# Patient Record
Sex: Male | Born: 1950
Health system: Southern US, Community
[De-identification: ages and names within clinical notes are randomized; demographics above are authoritative.]

## PROBLEM LIST (undated history)

## (undated) DIAGNOSIS — H8110 Benign paroxysmal vertigo, unspecified ear: Secondary | ICD-10-CM

## (undated) DIAGNOSIS — A6000 Herpesviral infection of urogenital system, unspecified: Secondary | ICD-10-CM

## (undated) DIAGNOSIS — N400 Enlarged prostate without lower urinary tract symptoms: Secondary | ICD-10-CM

## (undated) DIAGNOSIS — F419 Anxiety disorder, unspecified: Secondary | ICD-10-CM

## (undated) DIAGNOSIS — D235 Other benign neoplasm of skin of trunk: Secondary | ICD-10-CM

## (undated) DIAGNOSIS — T7840XA Allergy, unspecified, initial encounter: Secondary | ICD-10-CM

## (undated) DIAGNOSIS — K529 Noninfective gastroenteritis and colitis, unspecified: Secondary | ICD-10-CM

## (undated) DIAGNOSIS — M1990 Unspecified osteoarthritis, unspecified site: Secondary | ICD-10-CM

## (undated) DIAGNOSIS — L821 Other seborrheic keratosis: Secondary | ICD-10-CM

## (undated) DIAGNOSIS — Z973 Presence of spectacles and contact lenses: Secondary | ICD-10-CM

## (undated) DIAGNOSIS — E079 Disorder of thyroid, unspecified: Secondary | ICD-10-CM

## (undated) DIAGNOSIS — L309 Dermatitis, unspecified: Secondary | ICD-10-CM

## (undated) DIAGNOSIS — K635 Polyp of colon: Secondary | ICD-10-CM

## (undated) HISTORY — DX: Noninfective gastroenteritis and colitis, unspecified: K52.9

## (undated) HISTORY — DX: Unspecified osteoarthritis, unspecified site: M19.90

## (undated) HISTORY — DX: Benign paroxysmal vertigo, unspecified ear: H81.10

## (undated) HISTORY — DX: Benign prostatic hyperplasia without lower urinary tract symptoms: N40.0

## (undated) HISTORY — DX: Dermatitis, unspecified: L30.9

## (undated) HISTORY — PX: POLYPECTOMY: SHX149

## (undated) HISTORY — DX: Herpesviral infection of urogenital system, unspecified: A60.00

## (undated) HISTORY — PX: VASECTOMY: SHX75

## (undated) HISTORY — PX: KNEE ARTHROSCOPY: SUR90

## (undated) HISTORY — DX: Anxiety disorder, unspecified: F41.9

## (undated) HISTORY — DX: Other seborrheic keratosis: L82.1

## (undated) HISTORY — DX: Other benign neoplasm of skin of trunk: D23.5

## (undated) HISTORY — DX: Allergy, unspecified, initial encounter: T78.40XA

## (undated) HISTORY — DX: Disorder of thyroid, unspecified: E07.9

## (undated) HISTORY — DX: Polyp of colon: K63.5

## (undated) HISTORY — PX: TONSILLECTOMY: SUR1361

## (undated) HISTORY — DX: Presence of spectacles and contact lenses: Z97.3

## (undated) HISTORY — PX: HERNIA REPAIR: SHX51

---

## 2003-06-03 HISTORY — PX: INGUINAL HERNIA REPAIR: SUR1180

## 2008-08-15 ENCOUNTER — Encounter: Payer: Self-pay | Admitting: Gastroenterology

## 2008-09-21 ENCOUNTER — Ambulatory Visit: Payer: Self-pay | Admitting: Gastroenterology

## 2008-09-21 DIAGNOSIS — K219 Gastro-esophageal reflux disease without esophagitis: Secondary | ICD-10-CM

## 2008-09-21 DIAGNOSIS — R109 Unspecified abdominal pain: Secondary | ICD-10-CM

## 2008-09-27 ENCOUNTER — Encounter: Payer: Self-pay | Admitting: Gastroenterology

## 2008-09-27 ENCOUNTER — Ambulatory Visit: Payer: Self-pay | Admitting: Gastroenterology

## 2008-10-01 ENCOUNTER — Encounter: Payer: Self-pay | Admitting: Gastroenterology

## 2010-12-09 ENCOUNTER — Encounter (INDEPENDENT_AMBULATORY_CARE_PROVIDER_SITE_OTHER): Payer: Self-pay | Admitting: General Surgery

## 2010-12-10 ENCOUNTER — Ambulatory Visit (INDEPENDENT_AMBULATORY_CARE_PROVIDER_SITE_OTHER): Payer: BC Managed Care – PPO | Admitting: General Surgery

## 2010-12-10 ENCOUNTER — Encounter (INDEPENDENT_AMBULATORY_CARE_PROVIDER_SITE_OTHER): Payer: Self-pay | Admitting: General Surgery

## 2010-12-10 VITALS — BP 124/86 | HR 72 | Temp 97.4°F | Ht 71.75 in | Wt 140.6 lb

## 2010-12-10 DIAGNOSIS — K4090 Unilateral inguinal hernia, without obstruction or gangrene, not specified as recurrent: Secondary | ICD-10-CM | POA: Insufficient documentation

## 2010-12-10 DIAGNOSIS — N400 Enlarged prostate without lower urinary tract symptoms: Secondary | ICD-10-CM | POA: Insufficient documentation

## 2010-12-10 NOTE — Progress Notes (Signed)
Subjective:     Patient ID: Paul Sparks, male   DOB: 01-16-1951, 60 y.o.   MRN: [id number]    BP 124/86  Pulse 72  Temp 97.4 F (36.3 C)  Ht 5' 11.75" (1.822 m)  Wt 140 lb 9.6 oz (63.776 kg)  BMI 19.20 kg/m2    HPI This is a 60 year old male referred by Kennedy Bucker, PA, for evaluation of a painful left inguinal hernia. He noted a lump in his left groin approximately one month ago has become more uncomfortable with time. He went to urgent care and was evaluated and noted to have a left inguinal hernia that is reducible. No change in his bowel or bladder habits. No obstructive symptoms.  Past Medical History  Diagnosis Date  . Colon polyp   . IBD (inflammatory bowel disease)   . Inguinal hernia   . Prostate enlargement     mildly and asymptomatic  . Allergy   . Wears glasses   . Arthritis    Past Surgical History  Procedure Date  . Inguinal hernia repair 2005    right  . Tonsillectomy   . Knee arthroscopy   . Colonoscopy     screening    Allergies  Allergen Reactions  . Celebrex (Celecoxib)     REACTION: Hives  . Flexeril (Cyclobenzaprine Hcl)   . Nsaids   . Sulfa Drugs Cross Reactors    Current outpatient prescriptions:cromolyn (NASALCROM) 5.2 MG/ACT nasal spray, Place 1 spray into the nose 2 (two) times daily.  , Disp: , Rfl: ;  glucosamine-chondroitin 500-400 MG tablet, Take 1 tablet by mouth 3 (three) times daily.  , Disp: , Rfl: ;  loratadine (CLARITIN) 10 MG tablet, Take 10 mg by mouth daily.  , Disp: , Rfl: ;  minoxidil (ROGAINE) 2 % external solution, Apply topically 2 (two) times daily.  , Disp: , Rfl:  phenylephrine (SUDAFED PE) 10 MG TABS, Take 10 mg by mouth every 4 (four) hours as needed.  , Disp: , Rfl:   History   Social History  . Marital Status: Single    Spouse Name: N/A    Number of Children: N/A  . Years of Education: N/A   Occupational History  . Not on file.   Social History Main Topics  . Smoking status: Never Smoker   . Smokeless  tobacco: Not on file  . Alcohol Use: 0.0 oz/week    10-14 Glasses of wine per week  . Drug Use: No  . Sexually Active:    Other Topics Concern  . Not on file   Social History Narrative  . No narrative on file   Mr. Vandunk does not currently have medications on file.   Review of Systems General:  _X_Normal __Fever__Weight change __Night sweats __Fatigue __Sleep loss  Breast:  _X_Normal __Lump __Pain __Nipple discharge __Infection __Other  Infectious Diseases:  _X_Normal __HIV/AIDS __Tuberculosis __Hepatitis A       __ Hepatitis B __Hepatitis C __ STD __MRSA(staph infection) __Other  Dental:  _X_Normal __Dentures __Other  Cardiac: _X_Normal  __Pacemaker __Defibrillator __Bypass surgery __High blood pressure      __ Peripheral vascular disease __Heart attack __Irregular heart beat __Valve       disease  Pulmonary:  _X_Normal __Cough/Sputum __Bronchitis __Asthma __COPD                    __Short of breath __Pneumonia __Sleep Apnea  Endocrine:  _X_Normal __Diabetes __Thyroid disease __High Cholesterol __Other  Skin:  _X_Normal  __Rash __Bruise easily __Cancer __Abnormal moles __Other  Gastrointestinal:  __Normal __Nausea/Vomiting/Diarrhea _X_Colon polyp or Cancer       _X_Irritable Bowel disease __Poor appetite __Hiatal hernia or reflux __Ulcer       __Liver disease __Abdominal pain _X_Hernia __Rectal bleeding or hemorrhoids       __Other  Genitourinary:  __Normal __Kidney disease or stones _X_Prostate problems        __Blood in urine __Difficulty voiding __Incontinence/leakage __Other  Neurological:  _X_Normal __Stroke __Paralysis __Seizure __Alzheimers disease       __Headaches __Dizziness __Fainting __Weakness __Other  Hematologic/Lymphatic:  _X_Normal __Bleeding disorder __Blood clots __Anemia       __Swollen lymph nodes __Blood Transfusion __Other  Immune System:  _X_Normal __Previous/Current Cancer-type:  __Other  HEENT:  _X_Normal __Hearing loss  __Hearing aid __Ear  infection __Nose bleed       __Hoarseness __Sore throat __Blurred or double vision __Glasses or contacts       __Glaucoma __Retinopathy __Macular degeneration __Other  Musculoskeletal:  _X_Normal __Joint pain __Arthritis __Other  OB/GYN:  __Normal __Vaginal bleeding or discharge __Currently pregnant___        Trying to conceive___ 1st period age ___ Age at 52st pregnancy       ___ Breast feeding___  Menopause___ Hormones___        Breast cancer in family___          Objective:   Physical Exam  Constitutional: He appears well-nourished.       Thin.  Eyes: Conjunctivae and EOM are normal.  Cardiovascular: Normal rate, regular rhythm and normal heart sounds.   No murmur heard. Pulmonary/Chest: Effort normal and breath sounds normal. No respiratory distress.  Abdominal: Soft. Bowel sounds are normal. He exhibits no distension and no mass.  Genitourinary:       Right inguinal scar.  Left inguinal bulge that is reducible.  Testicles normal.  Musculoskeletal: Normal range of motion. He exhibits no edema.       Assessment:     Symptomatic, reducible left inguinal hernia.    Plan:     Open left inguinal hernia repair with mesh.  He prefers not to have general anesthesia.  I have explained the procedure, risks, and aftercare of inguinal hernia repair.  Risks include but are not limited to bleeding, infection, wound problems, anesthesia, recurrence, bladder or intestine injury, urinary retention, testicular dysfunction, chronic pain, mesh problems.  He/she seems to understand and agrees to proceed.

## 2010-12-11 DIAGNOSIS — D235 Other benign neoplasm of skin of trunk: Secondary | ICD-10-CM

## 2010-12-11 DIAGNOSIS — L309 Dermatitis, unspecified: Secondary | ICD-10-CM

## 2010-12-11 DIAGNOSIS — L821 Other seborrheic keratosis: Secondary | ICD-10-CM

## 2010-12-11 HISTORY — DX: Dermatitis, unspecified: L30.9

## 2010-12-11 HISTORY — DX: Other seborrheic keratosis: L82.1

## 2010-12-11 HISTORY — DX: Other benign neoplasm of skin of trunk: D23.5

## 2011-01-02 ENCOUNTER — Ambulatory Visit
Admission: RE | Admit: 2011-01-02 | Discharge: 2011-01-02 | Disposition: A | Discharge status: Home / Self Care | Payer: BC Managed Care – PPO | Source: Ambulatory Visit | Attending: General Surgery | Admitting: General Surgery

## 2011-01-02 ENCOUNTER — Other Ambulatory Visit (INDEPENDENT_AMBULATORY_CARE_PROVIDER_SITE_OTHER): Payer: Self-pay | Admitting: General Surgery

## 2011-01-02 DIAGNOSIS — Z01811 Encounter for preprocedural respiratory examination: Secondary | ICD-10-CM

## 2011-01-14 DIAGNOSIS — K4090 Unilateral inguinal hernia, without obstruction or gangrene, not specified as recurrent: Secondary | ICD-10-CM

## 2011-01-29 ENCOUNTER — Encounter (INDEPENDENT_AMBULATORY_CARE_PROVIDER_SITE_OTHER): Payer: Self-pay | Admitting: General Surgery

## 2011-01-29 ENCOUNTER — Ambulatory Visit (INDEPENDENT_AMBULATORY_CARE_PROVIDER_SITE_OTHER): Payer: BC Managed Care – PPO | Admitting: General Surgery

## 2011-01-29 VITALS — BP 118/82 | HR 76

## 2011-01-29 DIAGNOSIS — K4090 Unilateral inguinal hernia, without obstruction or gangrene, not specified as recurrent: Secondary | ICD-10-CM

## 2011-01-29 NOTE — Progress Notes (Signed)
Operation:  [facility] repair with mesh  Date:01/13/11  Pathology:na  HPI:  Paul Sparks returns for his first postop visit.  He had minimal pain postop.  Some constipation.   Physical Exam: Left groin incision is c/d/i with minimal swelling; repair feels solid.  Assessment:  Doing well post [facility] repair.  Plan:  Continue light activities for 4 more weeks.  F/u prn.

## 2011-01-29 NOTE — Patient Instructions (Signed)
Continue light activities for 4 more weeks and resume normal activities.

## 2012-03-24 ENCOUNTER — Ambulatory Visit (INDEPENDENT_AMBULATORY_CARE_PROVIDER_SITE_OTHER): Payer: BC Managed Care – PPO | Admitting: Family Medicine

## 2012-03-24 VITALS — BP 118/72 | HR 88 | Temp 98.9°F | Resp 20 | Ht 71.0 in | Wt 143.0 lb

## 2012-03-24 DIAGNOSIS — Z0000 Encounter for general adult medical examination without abnormal findings: Secondary | ICD-10-CM

## 2012-03-24 DIAGNOSIS — K589 Irritable bowel syndrome without diarrhea: Secondary | ICD-10-CM

## 2012-03-24 DIAGNOSIS — K580 Irritable bowel syndrome with diarrhea: Secondary | ICD-10-CM | POA: Insufficient documentation

## 2012-03-24 DIAGNOSIS — L989 Disorder of the skin and subcutaneous tissue, unspecified: Secondary | ICD-10-CM

## 2012-03-24 DIAGNOSIS — J309 Allergic rhinitis, unspecified: Secondary | ICD-10-CM | POA: Insufficient documentation

## 2012-03-24 DIAGNOSIS — Z23 Encounter for immunization: Secondary | ICD-10-CM

## 2012-03-24 LAB — COMPREHENSIVE METABOLIC PANEL
ALT: 13 U/L (ref 0–53)
Albumin: 4.5 g/dL (ref 3.5–5.2)
BUN: 11 mg/dL (ref 6–23)
CO2: 32 mEq/L (ref 19–32)
Calcium: 9.7 mg/dL (ref 8.4–10.5)
Chloride: 103 mEq/L (ref 96–112)
Creat: 0.82 mg/dL (ref 0.50–1.35)

## 2012-03-24 LAB — LIPID PANEL
Cholesterol: 228 mg/dL — ABNORMAL HIGH (ref 0–200)
Total CHOL/HDL Ratio: 4.4 Ratio

## 2012-03-24 LAB — POCT CBC
HCT, POC: 43.4 % — AB (ref 43.5–53.7)
Hemoglobin: 13.3 g/dL — AB (ref 14.1–18.1)
Lymph, poc: 0.8 (ref 0.6–3.4)
MCH, POC: 30.6 pg (ref 27–31.2)
MCHC: 30.6 g/dL — AB (ref 31.8–35.4)
WBC: 3.7 10*3/uL — AB (ref 4.6–10.2)

## 2012-03-24 LAB — POCT GLYCOSYLATED HEMOGLOBIN (HGB A1C): Hemoglobin A1C: 5

## 2012-03-24 LAB — TSH: TSH: 2.2 u[IU]/mL (ref 0.350–4.500)

## 2012-03-24 LAB — VITAMIN B12: Vitamin B-12: 387 pg/mL (ref 211–911)

## 2012-03-24 MED ORDER — HYOSCYAMINE SULFATE 0.375 MG PO CP12: 1.0000 | ORAL_CAPSULE | Freq: Two times a day (BID) | ORAL | Status: DC | PRN

## 2012-03-24 NOTE — Assessment & Plan Note (Signed)
New.  Persistent lesion L facial; refer to dermatology for further evaluation.

## 2012-03-24 NOTE — Patient Instructions (Addendum)
1. Routine general medical examination at a health care facility  POCT CBC, POCT glycosylated hemoglobin (Hb A1C), POCT urinalysis dipstick, POCT UA - Microscopic Only, Comprehensive metabolic panel, Lipid panel, PSA, TSH, T4, free, Vitamin B12, Vitamin D 25 hydroxy, Folate, EKG 12-Lead  2. Need for Tdap vaccination  Tdap vaccine greater than or equal to 63yo IM  3. Skin lesion  Vitamin B12, Ambulatory referral to Dermatology  4. Irritable bowel syndrome with diarrhea  Hyoscyamine Sulfate 0.375 MG CP12

## 2012-03-24 NOTE — Assessment & Plan Note (Signed)
Stable; new to this provider; agreeable to rx for Hyoscyamine for PRN use.

## 2012-03-24 NOTE — Assessment & Plan Note (Signed)
S/p TDAP 03/24/12.

## 2012-03-24 NOTE — Progress Notes (Signed)
[address]   [place], [place]  [address]   [phone]  Subjective:    Patient ID: Paul Sparks, male    DOB: 1951-02-06, 61 y.o.   MRN: [id number]  HPI This 61 y.o. male presents for evaluation for CPE.  Last physical age 49 (three years ago).  Tetanus vaccine less than ten years ago; no previous TDAP.  Pneumovax never.  Zostavax never; interested in receiving.  Influenza vaccine last week.  Colonoscopy +adenomatous polyp single; repeat age 36.  Eye exam several years; +reading glasses; no glaucoma or cataracts.  Dental exam every six months.  1.  Skin lesions: wants  Facial skin lesion reviewed.  No skin cancers in past; last dermatology evaluation was in college for acne.  2.  IBS diarrhea, abdominal pain:  Chronic issue for several years.  Suffers with abdominal cramping, diarrhea when travels.  Stress related.  GI consultation for colonoscopy three years ago; prescribed hyoscyamine for PRN which works well for symptoms.  Wanting refill on Hyoscyamine rx.  S/p colonoscopy 2010; rx provided by Claudette Head.   PMH:  Allergic Rhinitis (allergic to pollen year round; allergy testing serum), IBS abdominal pain, diarrhea (previously prescribed Hyoscyamine 0.125mg  two every 6 hours PRN) chronic Psurg:  Hernia repairs B, knee surgery. All: see list. Medications:  Sudafed daily, Claritin daily, Glucosamine daily.   Social:  Married x 3 years; third marriage; happily married; 2 children, no grandchildren.  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  No tobacco; alcohol x wine and beer 2 servings.  No drugs.  Exercising 20 minutes x daily.  +seatbelt 100%; no guns in the home.  +sunscreen; avoids sun Family:  M-died age 64; multi-infarct dementia.  Tobacco abuse.    F-died age 56; died of starvation/dehydration; depression.    Siblings-died of overdose age 72;  sister 4 healthy; brother 40 healthy.      Review of Systems  Constitutional: Negative for fever, chills, diaphoresis, activity change, appetite change, fatigue and unexpected weight change.  HENT: Positive for hearing loss and congestion. Negative for ear pain, nosebleeds, sore throat, facial swelling, rhinorrhea, sneezing, drooling, mouth sores, trouble swallowing, neck pain, neck stiffness, dental problem, voice change, postnasal drip, sinus pressure, tinnitus and ear discharge.   Eyes: Negative for photophobia, pain, discharge, redness, itching and visual disturbance.  Respiratory: Positive for shortness of breath. Negative for apnea, cough, choking, chest tightness, wheezing and stridor.        SOB occurs at times of stress; also has improved with increased exercise.  Cardiovascular: Negative for chest pain, palpitations and leg swelling.  Gastrointestinal: Positive for diarrhea. Negative for nausea, vomiting, abdominal pain, constipation, blood in stool, abdominal distention, anal bleeding and rectal pain.  Genitourinary: Negative for dysuria, urgency, frequency, hematuria, flank pain, decreased urine volume, discharge, penile swelling, scrotal swelling, enuresis, difficulty urinating, genital sores, penile pain and testicular pain.  Musculoskeletal: Positive for back pain and arthralgias. Negative for myalgias, joint swelling and gait problem.  Neurological: Positive for dizziness and headaches. Negative for tremors, seizures, syncope, facial asymmetry, speech difficulty, weakness, light-headedness and numbness.  Hematological: Negative for adenopathy. Does not bruise/bleed easily.  Psychiatric/Behavioral: Negative for suicidal ideas, hallucinations, behavioral problems, confusion, disturbed wake/sleep cycle, self-injury, dysphoric mood, decreased concentration and agitation. The patient is not nervous/anxious and is not hyperactive.  Objective:   Physical Exam  Constitutional: He  is oriented to person, place, and time. He appears well-developed and well-nourished.  HENT:  Head: Normocephalic and atraumatic.  Right Ear: External ear normal.  Left Ear: External ear normal.  Nose: Nose normal.  Mouth/Throat: Oropharynx is clear and moist. No oropharyngeal exudate.  Eyes: Conjunctivae normal and EOM are normal. Pupils are equal, round, and reactive to light.  Neck: Normal range of motion. Neck supple. No JVD present. No tracheal deviation present. No thyromegaly present.  Cardiovascular: Normal rate, regular rhythm, normal heart sounds and intact distal pulses.  Exam reveals no gallop and no friction rub.   No murmur heard. Pulmonary/Chest: Effort normal and breath sounds normal. No stridor. No respiratory distress. He has no wheezes. He has no rales.  Abdominal: Soft. Bowel sounds are normal. He exhibits no distension and no mass. There is no tenderness. There is no rebound and no guarding. Hernia confirmed negative in the right inguinal area and confirmed negative in the left inguinal area.  Genitourinary: Rectum normal, testes normal and penis normal. Rectal exam shows no mass and anal tone normal. Prostate is enlarged. Prostate is not tender. Circumcised. No penile tenderness.  Musculoskeletal: Normal range of motion.  Lymphadenopathy:    He has no cervical adenopathy.       Right: No inguinal adenopathy present.       Left: No inguinal adenopathy present.  Neurological: He is alert and oriented to person, place, and time. No cranial nerve deficit. He exhibits normal muscle tone.  Skin: Skin is warm and dry. No rash noted.       L facial region maxillary with milia type lesion with surrounding inferior erythema 4mm diameter.    Psychiatric: He has a normal mood and affect. His behavior is normal. Judgment and thought content normal.    TDAP ADMINISTERED DURING VISIT.  EKG: NSR; NO ACUTE CHANGES.     Assessment & Plan:   1. Routine general medical examination  at a health care facility  POCT CBC, POCT glycosylated hemoglobin (Hb A1C), POCT urinalysis dipstick, POCT UA - Microscopic Only, Comprehensive metabolic panel, Lipid panel, PSA, TSH, T4, free, Vitamin B12, Vitamin D 25 hydroxy, Folate, EKG 12-Lead  2. Need for Tdap vaccination  Tdap vaccine greater than or equal to 7yo IM  3. Skin lesion  Vitamin B12, Ambulatory referral to Dermatology  4. Irritable bowel syndrome with diarrhea  Hyoscyamine Sulfate 0.375 MG CP12

## 2012-03-24 NOTE — Assessment & Plan Note (Signed)
Anticipatory guidance provided --- start ASA 81mg  daily.  Colonoscopy UTD in 2010; s/p TDAP in office; rx for Zostavax provided to receive at pharmacy; s/p flu vaccine this season.  Obtain labs.

## 2012-03-25 LAB — VITAMIN D 25 HYDROXY (VIT D DEFICIENCY, FRACTURES): Vit D, 25-Hydroxy: 35 ng/mL (ref 30–89)

## 2012-04-06 NOTE — Progress Notes (Signed)
One month f-up appt made for 04/27/12 with Dr. Katrinka Blazing. Paul Sparks

## 2012-04-27 ENCOUNTER — Ambulatory Visit (INDEPENDENT_AMBULATORY_CARE_PROVIDER_SITE_OTHER): Payer: BC Managed Care – PPO | Admitting: Family Medicine

## 2012-04-27 ENCOUNTER — Encounter: Payer: Self-pay | Admitting: Family Medicine

## 2012-04-27 VITALS — BP 117/72 | HR 81 | Temp 98.3°F | Resp 16 | Ht 71.0 in | Wt 142.0 lb

## 2012-04-27 DIAGNOSIS — D649 Anemia, unspecified: Secondary | ICD-10-CM | POA: Insufficient documentation

## 2012-04-27 DIAGNOSIS — E538 Deficiency of other specified B group vitamins: Secondary | ICD-10-CM | POA: Insufficient documentation

## 2012-04-27 DIAGNOSIS — E7800 Pure hypercholesterolemia, unspecified: Secondary | ICD-10-CM

## 2012-04-27 LAB — CBC WITH DIFFERENTIAL/PLATELET
Hemoglobin: 14.2 g/dL (ref 13.0–17.0)
Lymphocytes Relative: 26 % (ref 12–46)
Lymphs Abs: 1 10*3/uL (ref 0.7–4.0)
MCH: 31.9 pg (ref 26.0–34.0)
Monocytes Relative: 9 % (ref 3–12)
Neutro Abs: 2.3 10*3/uL (ref 1.7–7.7)
Neutrophils Relative %: 63 % (ref 43–77)
Platelets: 161 10*3/uL (ref 150–400)
RBC: 4.45 MIL/uL (ref 4.22–5.81)
WBC: 3.7 10*3/uL — ABNORMAL LOW (ref 4.0–10.5)

## 2012-04-27 NOTE — Assessment & Plan Note (Signed)
New.  Tolerating multivitamin daily.  Obtain labs.

## 2012-04-27 NOTE — Assessment & Plan Note (Signed)
New.  Vegetarian diet with ideal BMI; also regular exercise.   Continue with dietary modification.  If worsens, will warrant statin therapy.

## 2012-04-27 NOTE — Patient Instructions (Addendum)
1. Anemia  CBC with Differential  2. Folate deficiency  Folate

## 2012-04-27 NOTE — Assessment & Plan Note (Signed)
New.  Repeat today.  Likely associated with recent illness at time of CPE one month ago.  If persists, will warrant hemosure completion and referral to GI.  Currently asymptomatic.

## 2012-04-27 NOTE — Progress Notes (Signed)
[address]   [place], [place]  [address]   [phone]  Subjective:    Patient ID: Paul Sparks, male    DOB: 07-30-1950, 61 y.o.   MRN: [id number]  HPIThis 61 y.o. male presents for one month follow-up for evaluation of the following:  1.  Anemia:  New at CPE one month ago.  Sister with abnormal labs in past two years; large work up without diagnosis/etiology.  Continues to monitor sister.  Colonoscopy 2010.  Physician in [place] tested H. Pylori negative in past due to chronic abdominal pain.  No history of EGD.  No bloody stools or melena.  S/p hernia surgery one year ago; bowels are unsettled since then.  More constipated now than baseline.  High roughage diet.  No nausea, vomiting.  No worsening abdominal pain.  No night sweats.  No unintentional weight loss.    2. Folic acid deficiency:  Started MVI.   Very high vegetarian diet.  Diet very high in folate.    3.  Hypercholesterolemia:  Persistently elevated. Vegetarian now.  Eats almost no cholesterol.  Exceptions are: yogurt daily with 10% cholesterol; no other foods with cholesterol.  B: oats, walnuts.  Lunch:  No added salt; soup all vegetables, spice.  Red wine.  Supper:  One egg per week; eggs, beans, toast with soy sausages, vegetarian chili.  Stir fry.  No family history of AMI.  CVAs in 19s.  Mother with multi-infarct dementia but smoker; onset late 69s.   No fish oil supplement due to GI side effects.    Review of Systems  Constitutional: Negative for fever, chills, diaphoresis, activity change, appetite change, fatigue and unexpected weight change.  Gastrointestinal: Positive for abdominal pain and constipation. Negative for nausea, vomiting, diarrhea, blood in stool, abdominal distention, anal bleeding and rectal pain.    Past Medical History  Diagnosis Date  . Colon polyp 06/02/2008    colonoscopy [facility].  . IBD (inflammatory bowel disease)     diarrhea, abdominal cramping.  . Inguinal hernia     Bilateral; s/p  repair.  . Prostate enlargement     mildly and asymptomatic  . Allergy   . Wears glasses   . Arthritis   . Eczema 12/11/10    [name].  . Seborrheic keratosis 12/11/10    [name]/Dermatology consult  . Dermatofibroma of back 12/11/10    [name].    Past Surgical History  Procedure Date  . Inguinal hernia repair 2005    right  . Tonsillectomy   . Knee arthroscopy   . Colonoscopy 06/02/2008    screening; single adenomatous polyp. Claudette Head, MD    Prior to Admission medications   Medication Sig Start Date End Date Taking? Authorizing Provider  aspirin 81 MG tablet Take 81 mg by mouth daily.   Yes Historical Provider, MD  cromolyn (NASALCROM) 5.2 MG/ACT nasal spray Place 1 spray into the nose 2 (two) times daily.     Yes Historical Provider, MD  glucosamine-chondroitin 500-400 MG tablet Take 1 tablet by mouth 3 (three) times daily.     Yes Historical Provider, MD  Hyoscyamine Sulfate 0.375 MG CP12 Take 1 capsule (0.375 mg total) by mouth 2 (two) times daily as needed. 03/24/12  Yes Ethelda Chick, MD  loratadine (CLARITIN) 10 MG tablet Take 10 mg by mouth daily.     Yes Historical Provider, MD  minoxidil (ROGAINE) 2 % external solution Apply topically 2 (two) times daily.     Yes Historical  Provider, MD  Multiple Vitamins-Minerals (MULTIVITAMIN WITH MINERALS) tablet Take 1 tablet by mouth daily.   Yes Historical Provider, MD  phenylephrine (SUDAFED PE) 10 MG TABS Take 10 mg by mouth every 4 (four) hours as needed.     Yes Historical Provider, MD    Allergies  Allergen Reactions  . Celebrex (Celecoxib)     REACTION: Hives  . Flexeril (Cyclobenzaprine Hcl)   . Nsaids   . Sulfa Drugs Cross Reactors     History   Social History  . Marital Status: Married    Spouse Name: N/A    Number of Children: N/A  . Years of Education: N/A   Occupational History  . Not on file.   Social History Main Topics  . Smoking status: Never Smoker   . Smokeless  tobacco: Never Used  . Alcohol Use: 0.0 oz/week    10-14 Glasses of wine per week  . Drug Use: No  . Sexually Active: Yes    Birth Control/ Protection: None   Other Topics Concern  . Not on file   Social History Narrative   Marital status:  Married x 3 years; third marriage; happily married.   Children: two; no grandchildren.   Lives: with wife.   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     Tobacco: none   Alcohol: two daily wine and beer.   Drugs: none   Exercise:  Walks daily for 20 minutes; +yoga.   Seatbelt: 100% of time   Guns in home: none   Sunscreen: yes; rare sun exposure.    Family History  Problem Relation Age of Onset  . Dementia Mother     Multi-infarct dementia  . Diabetes Father        Objective:   Physical Exam  Nursing note and vitals reviewed. Constitutional: He is oriented to person, place, and time. He appears well-developed and well-nourished. No distress.  Neck: Normal range of motion. Neck supple.  Cardiovascular: Normal rate, regular rhythm and normal heart sounds.  Exam reveals no gallop and no friction rub.   No murmur heard. Pulmonary/Chest: Effort normal and breath sounds normal.  Neurological: He is alert and oriented to person, place, and time. No cranial nerve deficit. He exhibits normal muscle tone. Coordination normal.  Skin: He is not diaphoretic.  Psychiatric: He has a normal mood and affect. His behavior is normal. Judgment and thought content normal.        Assessment & Plan:   1. Anemia  CBC with Differential  2. Folate deficiency  Folate  3. Pure hypercholesterolemia

## 2012-05-02 LAB — FOLATE: Folate: 14.3 ng/mL (ref >5.4)

## 2012-05-03 NOTE — Progress Notes (Signed)
Appt made with Dr. Katrinka Blazing for 08/03/11. Paul Sparks

## 2012-07-17 ENCOUNTER — Other Ambulatory Visit: Payer: Self-pay

## 2012-08-02 ENCOUNTER — Ambulatory Visit (INDEPENDENT_AMBULATORY_CARE_PROVIDER_SITE_OTHER): Payer: BC Managed Care – PPO | Admitting: Family Medicine

## 2012-08-02 ENCOUNTER — Encounter: Payer: Self-pay | Admitting: Family Medicine

## 2012-08-02 VITALS — BP 102/64 | HR 87 | Temp 98.2°F | Resp 16 | Ht 71.0 in | Wt 145.0 lb

## 2012-08-02 DIAGNOSIS — D649 Anemia, unspecified: Secondary | ICD-10-CM

## 2012-08-02 DIAGNOSIS — D72819 Decreased white blood cell count, unspecified: Secondary | ICD-10-CM | POA: Insufficient documentation

## 2012-08-02 DIAGNOSIS — M51369 Other intervertebral disc degeneration, lumbar region without mention of lumbar back pain or lower extremity pain: Secondary | ICD-10-CM | POA: Insufficient documentation

## 2012-08-02 DIAGNOSIS — R5381 Other malaise: Secondary | ICD-10-CM

## 2012-08-02 DIAGNOSIS — M5136 Other intervertebral disc degeneration, lumbar region: Secondary | ICD-10-CM | POA: Insufficient documentation

## 2012-08-02 DIAGNOSIS — M5030 Other cervical disc degeneration, unspecified cervical region: Secondary | ICD-10-CM | POA: Insufficient documentation

## 2012-08-02 DIAGNOSIS — R5383 Other fatigue: Secondary | ICD-10-CM | POA: Insufficient documentation

## 2012-08-02 LAB — COMPREHENSIVE METABOLIC PANEL
ALT: 17 U/L (ref 0–53)
AST: 23 U/L (ref 0–37)
Albumin: 4.4 g/dL (ref 3.5–5.2)
Alkaline Phosphatase: 72 U/L (ref 39–117)
BUN: 10 mg/dL (ref 6–23)
Calcium: 9.4 mg/dL (ref 8.4–10.5)
Chloride: 102 mEq/L (ref 96–112)
Potassium: 4.3 mEq/L (ref 3.5–5.3)
Sodium: 137 mEq/L (ref 135–145)
Total Protein: 6.5 g/dL (ref 6.0–8.3)

## 2012-08-02 LAB — CBC
HCT: 41.7 % (ref 39.0–52.0)
Hemoglobin: 14.6 g/dL (ref 13.0–17.0)
RBC: 4.54 MIL/uL (ref 4.22–5.81)
WBC: 3.9 10*3/uL — ABNORMAL LOW (ref 4.0–10.5)

## 2012-08-02 NOTE — Patient Instructions (Addendum)
Anemia - Plan: CBC  Leukocytopenia, unspecified  Other malaise and fatigue  Degenerative disc disease, cervical

## 2012-08-02 NOTE — Progress Notes (Signed)
[address]   [place], [place]  [address]   [phone]  Subjective:    Patient ID: Paul Sparks, male    DOB: 1950-08-28, 62 y.o.   MRN: [id number]  HPI This 62 y.o. male presents for three month follow-up and for evaluation of the following:  1.  Leukocytosis: due for repeat labs.  Last WBC 04/2012 with persistently low count.  +fatigue severe since last visit but major work stressors.  2.  Fatigue:  Onset two months ago.  Feels related to decision to continue working; was hoping to retire in 10/2012 when turning age 79 and now unable to retire at that age; not sure when will be able to retire.  Denies weight loss, night sweats, decrease in appetite.  Sleeping well.  Denies insomnia.  Denies headaches.  Denies chest pain, SOB, cough.  Denies nausea, vomiting, diarrhea, constipation, bloody stools, black stools.  Denies depressed mood but mood has been affected.  3.  Abdominal pain:  Has been suffering with worsening lower abdominal pressure and discomfort in lower abdominal region and genital region; onset after hernia surgery but has worsened since last visit.  Started taking Hyoscyamine one daily with improvement in lower abdominal and genital pressure.  4.  DDD neck and lumbar spine:  Chronic issue with recent worsening in past two months; followed regularly and chronically by chiropractor; scheduled to undergo repeat xrays next week; last xrays a year ago approximately; not interested in medications for pain.  5. Immunizations: s/p [facility] at [facility]; had to pay cash full.  Would have paid for immunization to be administered at doctor's office.   Review of Systems  Constitutional: Positive for fatigue. Negative for fever, chills, diaphoresis, activity change, appetite change and unexpected weight change.  Respiratory: Negative for cough and shortness of breath.   Cardiovascular: Negative for chest pain and palpitations.  Gastrointestinal: Positive for abdominal pain. Negative for  nausea, vomiting, diarrhea, constipation, blood in stool, abdominal distention, anal bleeding and rectal pain.  Musculoskeletal: Positive for myalgias, back pain and arthralgias.  Neurological: Negative for headaches.  Hematological: Negative for adenopathy. Does not bruise/bleed easily.  Psychiatric/Behavioral: Negative for behavioral problems, sleep disturbance and dysphoric mood. The patient is not nervous/anxious.         Past Medical History  Diagnosis Date  . Colon polyp 06/02/2008    colonoscopy Claudette Head.  . IBD (inflammatory bowel disease)     diarrhea, abdominal cramping.  . Inguinal hernia     Bilateral; s/p repair.  . Prostate enlargement     mildly and asymptomatic  . Allergy   . Wears glasses   . Arthritis   . Eczema 12/11/10    [name].  . Seborrheic keratosis 12/11/10    [name]/Dermatology consult  . Dermatofibroma of back 12/11/10    [name].    Past Surgical History  Procedure Laterality Date  . Inguinal hernia repair  2005    right  . Tonsillectomy    . Knee arthroscopy    . Colonoscopy  06/02/2008    screening; single adenomatous polyp. Claudette Head, MD    Prior to Admission medications   Medication Sig Start Date End Date Taking? Authorizing Provider  aspirin 81 MG tablet Take 81 mg by mouth daily.   Yes Historical Provider, MD  cromolyn (NASALCROM) 5.2 MG/ACT nasal spray Place 1 spray into the nose 2 (two) times daily.     Yes Historical Provider, MD  glucosamine-chondroitin 500-400 MG tablet Take 1 tablet by  mouth 3 (three) times daily.     Yes Historical Provider, MD  Hyoscyamine Sulfate 0.375 MG CP12 Take 1 capsule (0.375 mg total) by mouth 2 (two) times daily as needed. 03/24/12  Yes Ethelda Chick, MD  loratadine (CLARITIN) 10 MG tablet Take 10 mg by mouth daily.     Yes Historical Provider, MD  minoxidil (ROGAINE) 2 % external solution Apply topically 2 (two) times daily.     Yes Historical Provider, MD  Multiple  Vitamins-Minerals (MULTIVITAMIN WITH MINERALS) tablet Take 1 tablet by mouth daily.   Yes Historical Provider, MD  phenylephrine (SUDAFED PE) 10 MG TABS Take 10 mg by mouth every 4 (four) hours as needed.     Yes Historical Provider, MD    Allergies  Allergen Reactions  . Celebrex (Celecoxib)     REACTION: Hives  . Flexeril (Cyclobenzaprine Hcl)   . Nsaids   . Sulfa Drugs Cross Reactors     History   Social History  . Marital Status: Married    Spouse Name: N/A    Number of Children: N/A  . Years of Education: N/A   Occupational History  . Not on file.   Social History Main Topics  . Smoking status: Never Smoker   . Smokeless tobacco: Never Used  . Alcohol Use: 0.0 oz/week    10-14 Glasses of wine per week  . Drug Use: No  . Sexually Active: Yes    Birth Control/ Protection: None   Other Topics Concern  . Not on file   Social History Narrative   Marital status:  Married x 3 years; third marriage; happily married.      Children: two; no grandchildren.      Lives: with wife.      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        Tobacco: none      Alcohol: two daily wine and beer.      Drugs: none      Exercise:  Walks daily for 20 minutes; +yoga.      Seatbelt: 100% of time      Guns in home: none      Sunscreen: yes; rare sun exposure.    Family History  Problem Relation Age of Onset  . Dementia Mother     Multi-infarct dementia  . Diabetes Father     Objective:   Physical Exam  Nursing note and vitals reviewed. Constitutional: He is oriented to person, place, and time. He appears well-developed and well-nourished. No distress.  HENT:  Mouth/Throat: Oropharynx is clear and moist.  Eyes: Conjunctivae and EOM are normal. Pupils are equal, round, and reactive to light.  Neck: Normal range of motion. Neck supple. No thyromegaly present.  Cardiovascular: Normal rate, regular rhythm and normal heart sounds.  Exam  reveals no gallop and no friction rub.   No murmur heard. Pulmonary/Chest: Effort normal and breath sounds normal.  Abdominal: Soft. Bowel sounds are normal. He exhibits no distension and no mass. There is no tenderness. There is no rebound and no guarding. Hernia confirmed negative in the right inguinal area and confirmed negative in the left inguinal area.  Well healed incision scar LLQ.  Genitourinary: Testes normal and penis normal. Right testis shows no mass, no swelling and no tenderness. Left testis shows no mass, no swelling and no tenderness.  Musculoskeletal:       Right shoulder: Normal.  Left shoulder: Normal.       Cervical back: Normal.       Thoracic back: Normal.       Lumbar back: Normal.  Lymphadenopathy:    He has no cervical adenopathy.       Right: No inguinal adenopathy present.       Left: No inguinal adenopathy present.  Neurological: He is alert and oriented to person, place, and time.  Skin: Skin is warm and dry. No rash noted. He is not diaphoretic.  Psychiatric: He has a normal mood and affect. His behavior is normal. Judgment and thought content normal.       Assessment & Plan:  Anemia - Plan: CBC  Leukocytopenia, unspecified  Other malaise and fatigue  Degenerative disc disease, cervical  Degenerative disc disease, lumbar    1. Leukocytopenia: Persistent; associated with new onset fatigue; repeat labs today. 2.  Fatigue: New.  Onset two months ago with work related stressors.  Obtain labs including CBC, CMET, TSH, total testosterone levels.  Recent CPE with overall normal labs other than low WBC count.  Discussed symptoms of depression with recent stressors; advised to return if develops depressive symptoms. 3.  Cervical and Lumbar DDD:  Worsening; scheduled to undergo xrays with chiropractor next week and agreeable; warrants repeat films with worsening pain.  Declined rx for worsening symptoms. 4.  S/p Zostavax since last visit.

## 2012-08-17 ENCOUNTER — Encounter: Payer: Self-pay | Admitting: Family Medicine

## 2013-04-07 ENCOUNTER — Other Ambulatory Visit: Payer: Self-pay

## 2013-04-11 ENCOUNTER — Ambulatory Visit (INDEPENDENT_AMBULATORY_CARE_PROVIDER_SITE_OTHER): Payer: BC Managed Care – PPO | Admitting: Family Medicine

## 2013-04-11 ENCOUNTER — Encounter: Payer: Self-pay | Admitting: Family Medicine

## 2013-04-11 VITALS — BP 120/68 | HR 94 | Temp 98.0°F | Resp 16 | Ht 72.0 in | Wt 138.0 lb

## 2013-04-11 DIAGNOSIS — Z0000 Encounter for general adult medical examination without abnormal findings: Secondary | ICD-10-CM

## 2013-04-11 LAB — COMPREHENSIVE METABOLIC PANEL
ALT: 17 U/L (ref 0–53)
AST: 26 U/L (ref 0–37)
BUN: 10 mg/dL (ref 6–23)
CO2: 28 mEq/L (ref 19–32)
Calcium: 9.7 mg/dL (ref 8.4–10.5)
Chloride: 101 mEq/L (ref 96–112)
Creat: 0.8 mg/dL (ref 0.50–1.35)
Total Bilirubin: 0.7 mg/dL (ref 0.3–1.2)

## 2013-04-11 LAB — FOLATE: Folate: >20 ng/mL

## 2013-04-11 LAB — CBC WITH DIFFERENTIAL/PLATELET
Basophils Absolute: 0 10*3/uL (ref 0.0–0.1)
Eosinophils Absolute: 0.1 10*3/uL (ref 0.0–0.7)
Eosinophils Relative: 1 % (ref 0–5)
HCT: 43 % (ref 39.0–52.0)
Lymphocytes Relative: 21 % (ref 12–46)
MCH: 33 pg (ref 26.0–34.0)
MCHC: 34.9 g/dL (ref 30.0–36.0)
MCV: 94.7 fL (ref 78.0–100.0)
Monocytes Absolute: 0.4 10*3/uL (ref 0.1–1.0)
Platelets: 187 10*3/uL (ref 150–400)
RDW: 13.3 % (ref 11.5–15.5)

## 2013-04-11 LAB — LIPID PANEL
Cholesterol: 237 mg/dL — ABNORMAL HIGH (ref 0–200)
Triglycerides: 177 mg/dL — ABNORMAL HIGH (ref <150)
VLDL: 35 mg/dL (ref 0–40)

## 2013-04-11 LAB — HEMOGLOBIN A1C: Mean Plasma Glucose: 108 mg/dL (ref <117)

## 2013-04-11 LAB — POCT URINALYSIS DIPSTICK
Blood, UA: NEGATIVE
Ketones, UA: NEGATIVE
Protein, UA: NEGATIVE
Spec Grav, UA: 1.02
Urobilinogen, UA: 0.2

## 2013-04-11 LAB — IFOBT (OCCULT BLOOD): IFOBT: NEGATIVE

## 2013-04-11 LAB — TSH: TSH: 1.978 u[IU]/mL (ref 0.350–4.500)

## 2013-04-11 NOTE — Progress Notes (Signed)
[address]   [place], [place]  [address]   [phone]  Subjective:    Patient ID: Paul Sparks, male    DOB: 1950-11-28, 62 y.o.   MRN: [id number]  HPI This 62 y.o. male presents for Complete Physical exam.  Last physical 03/29/12. Colonoscopy 2010. TDAP 03/24/13. Pneumovax never. Zostavax 05/02/2012. Flu vaccine 02/2012; 01/2013 [facility]. Eye exam not yet.  Reading glasses.  No gluacoma or cataracts.   Dental exam not yet.  Review of Systems  Constitutional: Positive for fatigue. Negative for fever, chills, diaphoresis, activity change, appetite change and unexpected weight change.  HENT: Negative.   Eyes: Positive for visual disturbance.  Respiratory: Negative.   Cardiovascular: Negative.   Gastrointestinal: Positive for abdominal pain, diarrhea, constipation and rectal pain. Negative for nausea, blood in stool, abdominal distention and anal bleeding.  Endocrine: Negative.   Genitourinary: Negative.   Musculoskeletal: Positive for arthralgias, back pain, myalgias, neck pain and neck stiffness. Negative for gait problem and joint swelling.  Skin: Negative.   Allergic/Immunologic: Positive for environmental allergies.  Neurological: Negative.   Hematological: Bruises/bleeds easily.  Psychiatric/Behavioral: Positive for sleep disturbance. Negative for suicidal ideas, self-injury and dysphoric mood. The patient is nervous/anxious.    Past Medical History  Diagnosis Date  . Colon polyp 06/02/2008    colonoscopy [facility]. Repeat in 5 years.  . IBD (inflammatory bowel disease)     diarrhea, abdominal cramping.  . Inguinal hernia     Bilateral; s/p repair.  . Prostate enlargement     mildly and asymptomatic  . Allergy   . Wears glasses   . Arthritis   . Eczema 12/11/10    [name].  . Seborrheic keratosis 12/11/10    [name]/Dermatology consult  . Dermatofibroma of back 12/11/10    [name].   Past Surgical History  Procedure Laterality Date  .  Inguinal hernia repair  2005    right  . Tonsillectomy    . Knee arthroscopy    . Colonoscopy  06/02/2008    screening; single adenomatous polyp. Claudette Head, MD.  Repeat in 5 years.   Allergies  Allergen Reactions  . Celebrex [Celecoxib]     REACTION: Hives  . Flexeril [Cyclobenzaprine Hcl]   . Nsaids   . Sulfa Drugs Cross Reactors    Current Outpatient Prescriptions on File Prior to Visit  Medication Sig Dispense Refill  . aspirin 81 MG tablet Take 81 mg by mouth daily.      . cromolyn (NASALCROM) 5.2 MG/ACT nasal spray Place 1 spray into the nose 2 (two) times daily.        Marland Kitchen glucosamine-chondroitin 500-400 MG tablet Take 1 tablet by mouth 3 (three) times daily.        Marland Kitchen Hyoscyamine Sulfate 0.375 MG CP12 Take 1 capsule (0.375 mg total) by mouth 2 (two) times daily as needed.  60 each  5  . loratadine (CLARITIN) 10 MG tablet Take 10 mg by mouth daily.        . minoxidil (ROGAINE) 2 % external solution Apply topically 2 (two) times daily.        . Multiple Vitamins-Minerals (MULTIVITAMIN WITH MINERALS) tablet Take 1 tablet by mouth daily.      . phenylephrine (SUDAFED PE) 10 MG TABS Take 10 mg by mouth every 4 (four) hours as needed.         No current facility-administered medications on file prior to visit.   History   Social History  . Marital Status:  Married    Spouse Name: N/A    Number of Children: N/A  . Years of Education: N/A   Occupational History  . Not on file.   Social History Main Topics  . Smoking status: Never Smoker   . Smokeless tobacco: Never Used  . Alcohol Use: 0.0 oz/week    10-14 Glasses of wine per week  . Drug Use: No  . Sexual Activity: Yes    Birth Control/ Protection: None   Other Topics Concern  . Not on file   Social History Narrative   Marital status:  Married x 4 years; third marriage; happily married.      Children: two; no grandchildren.      Lives: with wife.      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        Tobacco: none      Alcohol: two daily wine and beer.        Drugs: none      Exercise:  Walks daily for 20 minutes; walks dog regularly.      Seatbelt: 100% of time      Guns in home: none      Sunscreen: yes; rare sun exposure.   Family History  Problem Relation Age of Onset  . Dementia Mother     Multi-infarct dementia  . Diabetes Father        Objective:   Physical Exam  Constitutional: He is oriented to person, place, and time. He appears well-developed and well-nourished. No distress.  HENT:  Head: Normocephalic and atraumatic.  Right Ear: External ear normal.  Left Ear: External ear normal.  Nose: Nose normal.  Mouth/Throat: Oropharynx is clear and moist.  Eyes: Conjunctivae and EOM are normal. Pupils are equal, round, and reactive to light.  Neck: Normal range of motion. Neck supple. Carotid bruit is not present. No thyromegaly present.  Cardiovascular: Normal rate, regular rhythm, normal heart sounds and intact distal pulses.  Exam reveals no gallop and no friction rub.   No murmur heard. Pulmonary/Chest: Effort normal and breath sounds normal. He has no wheezes. He has no rales.  Abdominal: Soft. Bowel sounds are normal. He exhibits no distension and no mass. There is no tenderness. There is no rebound and no guarding. Hernia confirmed negative in the right inguinal area and confirmed negative in the left inguinal area.  Genitourinary: Rectum normal, prostate normal, testes normal and penis normal. Right testis shows no mass, no swelling and no tenderness. Left testis shows no mass, no swelling and no tenderness. Circumcised.  Musculoskeletal:       Right shoulder: Normal.       Left shoulder: Normal.       Cervical back: Normal.  Lymphadenopathy:    He has no cervical adenopathy.       Right: No inguinal adenopathy present.       Left: No inguinal adenopathy present.  Neurological: He is alert and oriented to person, place, and time.  He has normal reflexes. No cranial nerve deficit. He exhibits normal muscle tone. Coordination normal.  Skin: Skin is warm and dry. No rash noted. He is not diaphoretic.  Psychiatric: He has a normal mood and affect. His behavior is normal. Judgment and thought content normal.       Assessment & Plan:  Annual physical exam - Plan: CBC with Differential, Comprehensive metabolic panel, TSH, Hemoglobin A1c, Lipid panel, EKG 12-Lead, PSA, Folate, IFOBT  POC (occult bld, rslt in office), POCT urinalysis dipstick, CANCELED: POCT UA - Microscopic Only   1. Complete physical exam: anticipatory guidance --- exercise, weight maintenance, ASA 81mg  daily.  Colonoscopy UTD.  Immunizations UTD.  Obtain labs.  No orders of the defined types were placed in this encounter.   Nilda Simmer, M.D.  [facility] [address] [place], [place]  [address] [phone] phone [phone] fax

## 2013-04-13 ENCOUNTER — Encounter: Payer: Self-pay | Admitting: Family Medicine

## 2013-07-21 ENCOUNTER — Encounter: Payer: Self-pay | Admitting: Gastroenterology

## 2013-09-23 ENCOUNTER — Encounter: Payer: Self-pay | Admitting: Gastroenterology

## 2013-11-10 ENCOUNTER — Ambulatory Visit (AMBULATORY_SURGERY_CENTER): Payer: Self-pay

## 2013-11-10 VITALS — Ht 71.75 in | Wt 139.0 lb

## 2013-11-10 DIAGNOSIS — Z860100 Personal history of colon polyps, unspecified: Secondary | ICD-10-CM

## 2013-11-10 MED ORDER — MOVIPREP 100 G PO SOLR: 1.0000 | Freq: Once | ORAL | Status: DC

## 2013-11-10 NOTE — Progress Notes (Signed)
No allergies to eggs or soy No home oxygen No past problems with anesthesia No diet/weight loss meds  Has email.  Emmi instructions given for colonoscopy. 

## 2013-11-24 ENCOUNTER — Encounter: Payer: Self-pay | Admitting: Gastroenterology

## 2013-11-24 ENCOUNTER — Ambulatory Visit (AMBULATORY_SURGERY_CENTER): Payer: BC Managed Care – PPO | Admitting: Gastroenterology

## 2013-11-24 VITALS — BP 110/67 | HR 68 | Temp 98.3°F | Resp 70 | Ht 71.0 in | Wt 139.0 lb

## 2013-11-24 DIAGNOSIS — D126 Benign neoplasm of colon, unspecified: Secondary | ICD-10-CM

## 2013-11-24 DIAGNOSIS — Z86010 Personal history of colonic polyps: Secondary | ICD-10-CM

## 2013-11-24 DIAGNOSIS — K635 Polyp of colon: Secondary | ICD-10-CM

## 2013-11-24 HISTORY — PX: COLONOSCOPY: SHX174

## 2013-11-24 HISTORY — DX: Polyp of colon: K63.5

## 2013-11-24 MED ORDER — SODIUM CHLORIDE 0.9 % IV SOLN: 500.0000 mL | INTRAVENOUS | Status: DC

## 2013-11-24 NOTE — Progress Notes (Signed)
No complaints noted in the recovery room. Maw   

## 2013-11-24 NOTE — Progress Notes (Signed)
A/ox3 pleased with MAC, report to Annette RN 

## 2013-11-24 NOTE — Patient Instructions (Signed)
YOU HAD AN ENDOSCOPIC PROCEDURE TODAY AT THE [facility]: Refer to the procedure report that was given to you for any specific questions about what was found during the examination.  If the procedure report does not answer your questions, please call your gastroenterologist to clarify.  If you requested that your care partner not be given the details of your procedure findings, then the procedure report has been included in a sealed envelope for you to review at your convenience later.  YOU SHOULD EXPECT: Some feelings of bloating in the abdomen. Passage of more gas than usual.  Walking can help get rid of the air that was put into your GI tract during the procedure and reduce the bloating. If you had a lower endoscopy (such as a colonoscopy or flexible sigmoidoscopy) you may notice spotting of blood in your stool or on the toilet paper. If you underwent a bowel prep for your procedure, then you may not have a normal bowel movement for a few days.  DIET: Your first meal following the procedure should be a light meal and then it is ok to progress to your normal diet.  A half-sandwich or bowl of soup is an example of a good first meal.  Heavy or fried foods are harder to digest and may make you feel nauseous or bloated.  Likewise meals heavy in dairy and vegetables can cause extra gas to form and this can also increase the bloating.  Drink plenty of fluids but you should avoid alcoholic beverages for 24 hours.  ACTIVITY: Your care partner should take you home directly after the procedure.  You should plan to take it easy, moving slowly for the rest of the day.  You can resume normal activity the day after the procedure however you should NOT DRIVE or use heavy machinery for 24 hours (because of the sedation medicines used during the test).    SYMPTOMS TO REPORT IMMEDIATELY: A gastroenterologist can be reached at any hour.  During normal business hours, 8:30 AM to 5:00 PM Monday through Friday,  call [phone].  After hours and on weekends, please call the GI answering service at [phone] who will take a message and have the physician on call contact you.   Following lower endoscopy (colonoscopy or flexible sigmoidoscopy):  Excessive amounts of blood in the stool  Significant tenderness or worsening of abdominal pains  Swelling of the abdomen that is new, acute  Fever of 100F or higher   FOLLOW UP: If any biopsies were taken you will be contacted by phone or by letter within the next 1-3 weeks.  Call your gastroenterologist if you have not heard about the biopsies in 3 weeks.  Our staff will call the home number listed on your records the next business day following your procedure to check on you and address any questions or concerns that you may have at that time regarding the information given to you following your procedure. This is a courtesy call and so if there is no answer at the home number and we have not heard from you through the emergency physician on call, we will assume that you have returned to your regular daily activities without incident.  SIGNATURES/CONFIDENTIALITY: You and/or your care partner have signed paperwork which will be entered into your electronic medical record.  These signatures attest to the fact that that the information above on your After Visit Summary has been reviewed and is understood.  Full responsibility of the confidentiality of   information lies with you and/or your care-partner.    Handout was given to your care partner on polyps. You may resume your current medications today. Await biopsy results. Please call if any questions or concerns.   

## 2013-11-24 NOTE — Op Note (Addendum)
[place]  [address] [place], [address]   COLONOSCOPY PROCEDURE REPORT  PATIENT: Paul Sparks, Paul Sparks  MR#: [id number] BIRTHDATE: Apr 21, 1951 , 46  yrs. old GENDER: Male ENDOSCOPIST: Ladene Artist, MD, [facility] PROCEDURE DATE:  11/24/2013 PROCEDURE:   Colonoscopy with biopsy and snare polypectomy First Screening Colonoscopy - Avg.  risk and is 50 yrs.  old or older - No.  Prior Negative Screening - Now for repeat screening. N/A  History of Adenoma - Now for follow-up colonoscopy & has been > or = to 3 yrs.  Yes hx of adenoma.  Has been 3 or more years since last colonoscopy.  Polyps Removed Today? Yes. ASA CLASS:   Class II INDICATIONS:Patient's personal history of adenomatous colon polyps.  MEDICATIONS: MAC sedation, administered by CRNA and propofol (Diprivan) 300mg  IV DESCRIPTION OF PROCEDURE:   After the risks benefits and alternatives of the procedure were thoroughly explained, informed consent was obtained.  A digital rectal exam revealed no abnormalities of the rectum.   The [id number]  endoscope was introduced through the anus and advanced to the cecum, which was identified by both the appendix and ileocecal valve. No adverse events experienced with a tortuous and redundant colon.   The quality of the prep was excellent, using MoviPrep  The instrument was then slowly withdrawn as the colon was fully examined.  COLON FINDINGS: Two sessile polyps ranging between 3-48mm in size were found at the cecum.  A polypectomy was performed with cold forceps and with a cold snare.  The resection was complete and the polyp tissue was completely retrieved.   Two sessile polyps ranging between 3-21mm in size were found in the ascending colon and transverse colon.  A polypectomy was performed with a cold snare. The resection was complete and the polyp tissue was completely retrieved.   The colon was otherwise normal.  There was no diverticulosis, inflammation,  polyps or cancers unless previously stated.  Retroflexed views revealed no abnormalities. The time to cecum=5 minutes 27 seconds.  Withdrawal time=12 minutes 30 seconds. The scope was withdrawn and the procedure completed. COMPLICATIONS: There were no complications.  ENDOSCOPIC IMPRESSION: 1.   Two sessile polyps, 3-5 mm, at the cecum; polypectomy performed with cold forceps and with a cold snare 2.   Two sessile polyps, 3-8 mm, in the ascending and transverse colon; polypectomy performed with a cold snare 3.   The colon was otherwise normal  RECOMMENDATIONS: 1.  Await pathology results 2.  Repeat Colonoscopy in 5 years.  eSigned:  Ladene Artist, MD, [facility] 11/24/2013 9:02 AM Revised: 11/24/2013 9:02 AM  cc: Reginia Forts, MD

## 2013-11-24 NOTE — Progress Notes (Signed)
Called to room to assist during endoscopic procedure.  Patient ID and intended procedure confirmed with present staff. Received instructions for my participation in the procedure from the performing physician.  

## 2013-11-25 ENCOUNTER — Telehealth: Payer: Self-pay

## 2013-11-25 NOTE — Telephone Encounter (Signed)
  Follow up Call-  Call back number 11/24/2013  Post procedure Call Back phone  # [phone]  Permission to leave phone message Yes     Patient questions:  Do you have a fever, pain , or abdominal swelling? No. Pain Score  0 *  Have you tolerated food without any problems? Yes.    Have you been able to return to your normal activities? Yes.    Do you have any questions about your discharge instructions: Diet   No. Medications  No. Follow up visit  No.  Do you have questions or concerns about your Care? No.  Actions: * If pain score is 4 or above: No action needed, pain <4.

## 2013-12-04 ENCOUNTER — Encounter: Payer: Self-pay | Admitting: Gastroenterology

## 2014-04-17 ENCOUNTER — Ambulatory Visit (INDEPENDENT_AMBULATORY_CARE_PROVIDER_SITE_OTHER): Payer: BC Managed Care – PPO | Admitting: Family Medicine

## 2014-04-17 ENCOUNTER — Other Ambulatory Visit: Payer: Self-pay | Admitting: Family Medicine

## 2014-04-17 ENCOUNTER — Encounter: Payer: Self-pay | Admitting: Family Medicine

## 2014-04-17 VITALS — BP 100/66 | HR 71 | Temp 98.3°F | Resp 16 | Ht 71.0 in | Wt 135.2 lb

## 2014-04-17 DIAGNOSIS — Z1329 Encounter for screening for other suspected endocrine disorder: Secondary | ICD-10-CM

## 2014-04-17 DIAGNOSIS — Z23 Encounter for immunization: Secondary | ICD-10-CM

## 2014-04-17 DIAGNOSIS — Z0000 Encounter for general adult medical examination without abnormal findings: Secondary | ICD-10-CM

## 2014-04-17 DIAGNOSIS — Z131 Encounter for screening for diabetes mellitus: Secondary | ICD-10-CM

## 2014-04-17 DIAGNOSIS — Z681 Body mass index (BMI) 19 or less, adult: Secondary | ICD-10-CM

## 2014-04-17 DIAGNOSIS — E7800 Pure hypercholesterolemia, unspecified: Secondary | ICD-10-CM

## 2014-04-17 DIAGNOSIS — A6000 Herpesviral infection of urogenital system, unspecified: Secondary | ICD-10-CM

## 2014-04-17 DIAGNOSIS — N342 Other urethritis: Secondary | ICD-10-CM

## 2014-04-17 DIAGNOSIS — Z125 Encounter for screening for malignant neoplasm of prostate: Secondary | ICD-10-CM

## 2014-04-17 LAB — POCT URINALYSIS DIPSTICK
BILIRUBIN UA: NEGATIVE
GLUCOSE UA: NEGATIVE
Ketones, UA: NEGATIVE
LEUKOCYTES UA: NEGATIVE
NITRITE UA: NEGATIVE
Protein, UA: NEGATIVE
Spec Grav, UA: 1.01
Urobilinogen, UA: 0.2
pH, UA: 6.5

## 2014-04-17 LAB — CBC WITH DIFFERENTIAL/PLATELET
Basophils Absolute: 0 10*3/uL (ref 0.0–0.1)
Basophils Relative: 0 % (ref 0–1)
EOS PCT: 1 % (ref 0–5)
Eosinophils Absolute: 0 10*3/uL (ref 0.0–0.7)
HCT: 44 % (ref 39.0–52.0)
Hemoglobin: 15.3 g/dL (ref 13.0–17.0)
LYMPHS PCT: 25 % (ref 12–46)
Lymphs Abs: 0.9 10*3/uL (ref 0.7–4.0)
MCH: 32.3 pg (ref 26.0–34.0)
MCHC: 34.8 g/dL (ref 30.0–36.0)
MCV: 93 fL (ref 78.0–100.0)
MONO ABS: 0.3 10*3/uL (ref 0.1–1.0)
Monocytes Relative: 9 % (ref 3–12)
Neutro Abs: 2.3 10*3/uL (ref 1.7–7.7)
Neutrophils Relative %: 65 % (ref 43–77)
PLATELETS: 179 10*3/uL (ref 150–400)
RBC: 4.73 MIL/uL (ref 4.22–5.81)
RDW: 12.9 % (ref 11.5–15.5)
WBC: 3.6 10*3/uL — ABNORMAL LOW (ref 4.0–10.5)

## 2014-04-17 LAB — COMPLETE METABOLIC PANEL WITH GFR
ALT: 19 U/L (ref 0–53)
AST: 25 U/L (ref 0–37)
Albumin: 4.8 g/dL (ref 3.5–5.2)
Alkaline Phosphatase: 76 U/L (ref 39–117)
BUN: 9 mg/dL (ref 6–23)
CALCIUM: 9.7 mg/dL (ref 8.4–10.5)
CHLORIDE: 101 meq/L (ref 96–112)
CO2: 29 meq/L (ref 19–32)
CREATININE: 0.7 mg/dL (ref 0.50–1.35)
GFR, Est Non African American: >89 mL/min
Glucose, Bld: 93 mg/dL (ref 70–99)
Potassium: 4.4 mEq/L (ref 3.5–5.3)
Sodium: 140 mEq/L (ref 135–145)
Total Bilirubin: 0.8 mg/dL (ref 0.2–1.2)
Total Protein: 7.4 g/dL (ref 6.0–8.3)

## 2014-04-17 LAB — HEMOGLOBIN A1C
Hgb A1c MFr Bld: 5.3 % (ref <5.7)
MEAN PLASMA GLUCOSE: 105 mg/dL (ref <117)

## 2014-04-17 LAB — TSH: TSH: 2.004 u[IU]/mL (ref 0.350–4.500)

## 2014-04-17 LAB — LIPID PANEL
CHOLESTEROL: 245 mg/dL — AB (ref 0–200)
HDL: 69 mg/dL (ref >39)
LDL Cholesterol: 148 mg/dL — ABNORMAL HIGH (ref 0–99)
TRIGLYCERIDES: 140 mg/dL (ref <150)
Total CHOL/HDL Ratio: 3.6 Ratio
VLDL: 28 mg/dL (ref 0–40)

## 2014-04-17 MED ORDER — CIPROFLOXACIN HCL 500 MG PO TABS: 500.0000 mg | ORAL_TABLET | Freq: Two times a day (BID) | ORAL | Status: DC

## 2014-04-17 MED ORDER — VALACYCLOVIR HCL 500 MG PO TABS: 500.0000 mg | ORAL_TABLET | Freq: Three times a day (TID) | ORAL | Status: DC

## 2014-04-17 NOTE — Progress Notes (Signed)
Subjective:    Patient ID: Paul Sparks, male    DOB: 28-Jan-1951, 63 y.o.   MRN: [id number]  04/17/2014  Annual Exam   HPI This 63 y.o. male presents for Complete Physical Examination.  Last physical:  04/11/2013 Colonoscopy:  11/24/2013; four polyps; [facility].  Repeat in 5 years.  TDAP:  2013 Pneumovax:  never Zostavax:  05/02/2012 Influenza: 02/24/2014 [facility]. Eye exam:  +glasses; last eye exam 2007. Dental exam:  Every six months.  Scheduled in 9 days.   Urethra irritation: had genital herpes outbreak after colonoscopy in 10/2013; outbreak was prolonged duration than usual; continues to suffer with persistent penile burning and irritation.  Intermittent dysuria. No penile discharge. No testicular pain or swelling.    Review of Systems  Constitutional: Negative for fever, chills, diaphoresis, activity change, appetite change, fatigue and unexpected weight change.  HENT: Negative for congestion, dental problem, drooling, ear discharge, ear pain, facial swelling, hearing loss, mouth sores, nosebleeds, postnasal drip, rhinorrhea, sinus pressure, sneezing, sore throat, tinnitus, trouble swallowing and voice change.   Eyes: Negative for photophobia, pain, discharge, redness, itching and visual disturbance.  Respiratory: Negative for apnea, cough, choking, chest tightness, shortness of breath, wheezing and stridor.   Cardiovascular: Negative for chest pain, palpitations and leg swelling.  Gastrointestinal: Negative for nausea, vomiting, abdominal pain, diarrhea, constipation and blood in stool.  Endocrine: Negative for cold intolerance, heat intolerance, polydipsia, polyphagia and polyuria.  Genitourinary: Positive for dysuria and penile pain. Negative for urgency, frequency, hematuria, flank pain, decreased urine volume, discharge, penile swelling, scrotal swelling, enuresis, difficulty urinating, genital sores and testicular pain.  Musculoskeletal: Negative for myalgias, back pain,  joint swelling, arthralgias, gait problem, neck pain and neck stiffness.  Skin: Negative for color change, pallor, rash and wound.  Allergic/Immunologic: Negative for environmental allergies, food allergies and immunocompromised state.  Neurological: Negative for dizziness, tremors, seizures, syncope, facial asymmetry, speech difficulty, weakness, light-headedness, numbness and headaches.  Hematological: Negative for adenopathy. Does not bruise/bleed easily.  Psychiatric/Behavioral: Negative for suicidal ideas, hallucinations, behavioral problems, confusion, sleep disturbance, self-injury, dysphoric mood, decreased concentration and agitation. The patient is not nervous/anxious and is not hyperactive.     Past Medical History  Diagnosis Date  . Colon polyp 11/24/2013    colonoscopy [name]. Repeat in 5 years.  . IBD (inflammatory bowel disease)     diarrhea, abdominal cramping.  . Inguinal hernia     Bilateral; s/p repair.  . Prostate enlargement     mildly and asymptomatic  . Allergy   . Wears glasses   . Arthritis   . Eczema 12/11/10    [name].  . Seborrheic keratosis 12/11/10    [name]/Dermatology consult  . Dermatofibroma of back 12/11/10    [name].  . Genital herpes    Past Surgical History  Procedure Laterality Date  . Inguinal hernia repair  2005    right  . Tonsillectomy    . Knee arthroscopy    . Colonoscopy  11/24/2013    four polyp. [name], MD.  Repeat in 5 years.  . Vasectomy     Allergies  Allergen Reactions  . Celebrex [Celecoxib]     REACTION: Hives  . Flexeril [Cyclobenzaprine Hcl]   . Nsaids   . Sulfa Drugs Cross Reactors    Current Outpatient Prescriptions  Medication Sig Dispense Refill  . aspirin 81 MG tablet Take 81 mg by mouth daily.    . cromolyn (NASALCROM) 5.2 MG/ACT nasal spray Place 1 spray into  the nose 2 (two) times daily.      . Glucosamine HCl (GLUCOSAMINE PO) Take by mouth daily.    Marland Kitchen Hyoscyamine  Sulfate 0.375 MG CP12 Take 1 capsule (0.375 mg total) by mouth 2 (two) times daily as needed. 60 each 5  . loratadine (CLARITIN) 10 MG tablet Take 10 mg by mouth daily.      . minoxidil (ROGAINE) 2 % external solution Apply topically 2 (two) times daily.      . Multiple Vitamins-Minerals (MULTIVITAMIN WITH MINERALS) tablet Take 1 tablet by mouth daily.    Marland Kitchen OVER THE COUNTER MEDICATION ubiquinal 289m (co q 10)    . phenylephrine (SUDAFED PE) 10 MG TABS Take 10 mg by mouth every 4 (four) hours as needed.      . ciprofloxacin (CIPRO) 500 MG tablet Take 1 tablet (500 mg total) by mouth 2 (two) times daily. 30 tablet 0  . glucosamine-chondroitin 500-400 MG tablet Take 1 tablet by mouth 3 (three) times daily.      . valACYclovir (VALTREX) 500 MG tablet Take 1 tablet (500 mg total) by mouth 3 (three) times daily. 30 tablet 3   No current facility-administered medications for this visit.       Objective:    BP 100/66 mmHg  Pulse 71  Temp(Src) 98.3 F (36.8 C) (Oral)  Resp 16  Ht _0  (1.803 m)  Wt 135 lb 3.2 oz (61.326 kg)  BMI 18.86 kg/m2  SpO2 98% Physical Exam  Constitutional: He is oriented to person, place, and time. He appears well-developed and well-nourished. No distress.  HENT:  Head: Normocephalic and atraumatic.  Right Ear: External ear normal.  Left Ear: External ear normal.  Nose: Nose normal.  Mouth/Throat: Oropharynx is clear and moist.  Eyes: Conjunctivae and EOM are normal. Pupils are equal, round, and reactive to light.  Neck: Normal range of motion. Neck supple. Carotid bruit is not present. No thyromegaly present.  Cardiovascular: Normal rate, regular rhythm, normal heart sounds and intact distal pulses.  Exam reveals no gallop and no friction rub.   No murmur heard. Pulmonary/Chest: Effort normal and breath sounds normal. He has no wheezes. He has no rales.  Abdominal: Soft. Bowel sounds are normal. He exhibits no distension and no mass. There is no tenderness.  There is no rebound and no guarding. Hernia confirmed negative in the right inguinal area and confirmed negative in the left inguinal area.  Genitourinary: Rectum normal, testes normal and penis normal. Prostate is enlarged. Prostate is not tender. Right testis shows no mass, no swelling and no tenderness. Right testis is descended. Left testis shows no mass, no swelling and no tenderness. Left testis is descended. Circumcised. No penile erythema or penile tenderness. No discharge found.  Musculoskeletal:       Right shoulder: Normal.       Left shoulder: Normal.       Cervical back: Normal.  Lymphadenopathy:    He has no cervical adenopathy.       Right: No inguinal adenopathy present.       Left: No inguinal adenopathy present.  Neurological: He is alert and oriented to person, place, and time. He has normal reflexes. No cranial nerve deficit. He exhibits normal muscle tone. Coordination normal.  Skin: Skin is warm and dry. No rash noted. He is not diaphoretic.  1 cm lipomatous lesion L temporal region.  Psychiatric: He has a normal mood and affect. His behavior is normal. Judgment and thought content normal.  Results for orders placed or performed in visit on 04/17/14  Lipid panel  Result Value Ref Range   Cholesterol 245 (H) 0 - 200 mg/dL   Triglycerides 140 <150 mg/dL   HDL 69 >39 mg/dL   Total CHOL/HDL Ratio 3.6 Ratio   VLDL 28 0 - 40 mg/dL   LDL Cholesterol 148 (H) 0 - 99 mg/dL  PSA  Result Value Ref Range   PSA  <=4.00 ng/mL  COMPLETE METABOLIC PANEL WITH GFR  Result Value Ref Range   Sodium 140 135 - 145 mEq/L   Potassium 4.4 3.5 - 5.3 mEq/L   Chloride 101 96 - 112 mEq/L   CO2 29 19 - 32 mEq/L   Glucose, Bld 93 70 - 99 mg/dL   BUN 9 6 - 23 mg/dL   Creat 0.70 0.50 - 1.35 mg/dL   Total Bilirubin 0.8 0.2 - 1.2 mg/dL   Alkaline Phosphatase 76 39 - 117 U/L   AST 25 0 - 37 U/L   ALT 19 0 - 53 U/L   Total Protein 7.4 6.0 - 8.3 g/dL   Albumin 4.8 3.5 - 5.2 g/dL   Calcium  9.7 8.4 - 10.5 mg/dL   GFR, Est African American >89 mL/min   GFR, Est Non African American >89 mL/min  CBC with Differential  Result Value Ref Range   WBC 3.6 (L) 4.0 - 10.5 K/uL   RBC 4.73 4.22 - 5.81 MIL/uL   Hemoglobin 15.3 13.0 - 17.0 g/dL   HCT 44.0 39.0 - 52.0 %   MCV 93.0 78.0 - 100.0 fL   MCH 32.3 26.0 - 34.0 pg   MCHC 34.8 30.0 - 36.0 g/dL   RDW 12.9 11.5 - 15.5 %   Platelets 179 150 - 400 K/uL   MPV Not Performed 9.4 - 12.4 fL   Neutrophils Relative % 65 43 - 77 %   Neutro Abs 2.3 1.7 - 7.7 K/uL   Lymphocytes Relative 25 12 - 46 %   Lymphs Abs 0.9 0.7 - 4.0 K/uL   Monocytes Relative 9 3 - 12 %   Monocytes Absolute 0.3 0.1 - 1.0 K/uL   Eosinophils Relative 1 0 - 5 %   Eosinophils Absolute 0.0 0.0 - 0.7 K/uL   Basophils Relative 0 0 - 1 %   Basophils Absolute 0.0 0.0 - 0.1 K/uL   Smear Review Criteria for review not met   TSH  Result Value Ref Range   TSH 2.004 0.350 - 4.500 uIU/mL  Hemoglobin A1c  Result Value Ref Range   Hgb A1c MFr Bld  <5.7 %   Mean Plasma Glucose  <117 mg/dL  POCT urinalysis dipstick  Result Value Ref Range   Color, UA yellow    Clarity, UA clear    Glucose, UA neg    Bilirubin, UA neg    Ketones, UA neg    Spec Grav, UA 1.010    Blood, UA trace    pH, UA 6.5    Protein, UA neg    Urobilinogen, UA 0.2    Nitrite, UA neg    Leukocytes, UA Negative    PREVNAR-13 ADMINISTERED.    Assessment & Plan:   1. Annual physical exam   2. Pure hypercholesterolemia   3. Screening for prostate cancer   4. Screening for diabetes mellitus   5. Need for prophylactic vaccination against Streptococcus pneumoniae (pneumococcus)   6. Screening for thyroid disorder   7. Genital herpes   8. Urethritis  9. BMI less than 19,adult       1.  Complete Physical Examination: anticipatory guidance --- weight gain, exercise.  Colonoscopy UTD.  S/p Prevnar 13 in office.  No hearing loss. No evidence of depression or anxiety.  Low fall risk. Independent  with ADLs.  2.  Hypercholesterolemia:  Obtain FLP.  Continue with exercise, low-cholesterol food choices. 3.  Screening DMII: obtain glucose. 4. Screening prostate cancer: DRE completed with enlarged prostate; PSA obtained. 5.  Screening for thyroid disorder: obtain TSH. 6.  Genital Herpes: chronic issue; rx for Valtrex provided.  Obtain HIV, RPR. 7.  Urethritis:  New.  Obtain urine; treat with Cipro.  If persists, obtain uripobe.  Send urine culture. 8.  BMI less than 19: New.  Recommend weight gain.    Meds ordered this encounter  Medications  . Glucosamine HCl (GLUCOSAMINE PO)    Sig: Take by mouth daily.  . ciprofloxacin (CIPRO) 500 MG tablet    Sig: Take 1 tablet (500 mg total) by mouth 2 (two) times daily.    Dispense:  30 tablet    Refill:  0  . valACYclovir (VALTREX) 500 MG tablet    Sig: Take 1 tablet (500 mg total) by mouth 3 (three) times daily.    Dispense:  30 tablet    Refill:  3    Return in about 1 year (around 04/18/2015) for complete physical examiniation.    Reginia Forts, M.D.  [facility] [address] [place], [place]  [address] [phone] phone [phone] fax

## 2014-04-17 NOTE — Patient Instructions (Signed)

## 2014-04-17 NOTE — Addendum Note (Signed)
Addended by: Kem Boroughs D on: 04/17/2014 05:56 PM   Modules accepted: Orders

## 2014-04-18 LAB — RPR

## 2014-04-18 LAB — PSA: PSA: 2.07 ng/mL (ref <=4.00)

## 2014-04-18 LAB — HIV ANTIBODY (ROUTINE TESTING W REFLEX): HIV: NONREACTIVE

## 2014-04-19 LAB — URINE CULTURE
Colony Count: NO GROWTH
ORGANISM ID, BACTERIA: NO GROWTH

## 2015-04-18 ENCOUNTER — Encounter: Payer: BC Managed Care – PPO | Admitting: Family Medicine

## 2015-04-20 ENCOUNTER — Ambulatory Visit (INDEPENDENT_AMBULATORY_CARE_PROVIDER_SITE_OTHER): Payer: BLUE CROSS/BLUE SHIELD | Admitting: Family Medicine

## 2015-04-20 ENCOUNTER — Encounter: Payer: Self-pay | Admitting: Family Medicine

## 2015-04-20 VITALS — BP 111/70 | HR 82 | Temp 98.6°F | Resp 16 | Ht 71.25 in | Wt 131.8 lb

## 2015-04-20 DIAGNOSIS — Z23 Encounter for immunization: Secondary | ICD-10-CM | POA: Diagnosis not present

## 2015-04-20 DIAGNOSIS — D708 Other neutropenia: Secondary | ICD-10-CM | POA: Insufficient documentation

## 2015-04-20 DIAGNOSIS — Z131 Encounter for screening for diabetes mellitus: Secondary | ICD-10-CM | POA: Diagnosis not present

## 2015-04-20 DIAGNOSIS — Z0000 Encounter for general adult medical examination without abnormal findings: Secondary | ICD-10-CM | POA: Diagnosis not present

## 2015-04-20 DIAGNOSIS — D72819 Decreased white blood cell count, unspecified: Secondary | ICD-10-CM | POA: Diagnosis not present

## 2015-04-20 DIAGNOSIS — K580 Irritable bowel syndrome with diarrhea: Secondary | ICD-10-CM | POA: Diagnosis not present

## 2015-04-20 DIAGNOSIS — N400 Enlarged prostate without lower urinary tract symptoms: Secondary | ICD-10-CM | POA: Diagnosis not present

## 2015-04-20 DIAGNOSIS — M5030 Other cervical disc degeneration, unspecified cervical region: Secondary | ICD-10-CM

## 2015-04-20 DIAGNOSIS — R636 Underweight: Secondary | ICD-10-CM | POA: Diagnosis not present

## 2015-04-20 DIAGNOSIS — M51369 Other intervertebral disc degeneration, lumbar region without mention of lumbar back pain or lower extremity pain: Secondary | ICD-10-CM

## 2015-04-20 DIAGNOSIS — J302 Other seasonal allergic rhinitis: Secondary | ICD-10-CM | POA: Diagnosis not present

## 2015-04-20 DIAGNOSIS — E7800 Pure hypercholesterolemia, unspecified: Secondary | ICD-10-CM

## 2015-04-20 DIAGNOSIS — A6000 Herpesviral infection of urogenital system, unspecified: Secondary | ICD-10-CM

## 2015-04-20 DIAGNOSIS — R634 Abnormal weight loss: Secondary | ICD-10-CM | POA: Diagnosis not present

## 2015-04-20 DIAGNOSIS — Z1159 Encounter for screening for other viral diseases: Secondary | ICD-10-CM | POA: Diagnosis not present

## 2015-04-20 DIAGNOSIS — R946 Abnormal results of thyroid function studies: Secondary | ICD-10-CM

## 2015-04-20 DIAGNOSIS — M5136 Other intervertebral disc degeneration, lumbar region: Secondary | ICD-10-CM

## 2015-04-20 LAB — LIPID PANEL
Cholesterol: 239 mg/dL — ABNORMAL HIGH (ref 125–200)
HDL: 62 mg/dL (ref >=40)
LDL Cholesterol: 146 mg/dL — ABNORMAL HIGH (ref <130)
Total CHOL/HDL Ratio: 3.9 Ratio (ref <=5.0)
Triglycerides: 153 mg/dL — ABNORMAL HIGH (ref <150)
VLDL: 31 mg/dL — ABNORMAL HIGH (ref <30)

## 2015-04-20 LAB — CBC WITH DIFFERENTIAL/PLATELET
BASOS ABS: 0 10*3/uL (ref 0.0–0.1)
Basophils Relative: 0 % (ref 0–1)
EOS ABS: 0.1 10*3/uL (ref 0.0–0.7)
Eosinophils Relative: 2 % (ref 0–5)
HCT: 44.2 % (ref 39.0–52.0)
HEMOGLOBIN: 15 g/dL (ref 13.0–17.0)
LYMPHS ABS: 0.9 10*3/uL (ref 0.7–4.0)
Lymphocytes Relative: 22 % (ref 12–46)
MCH: 32.3 pg (ref 26.0–34.0)
MCHC: 33.9 g/dL (ref 30.0–36.0)
MCV: 95.3 fL (ref 78.0–100.0)
MONOS PCT: 8 % (ref 3–12)
MPV: 10.5 fL (ref 8.6–12.4)
Monocytes Absolute: 0.3 10*3/uL (ref 0.1–1.0)
Neutro Abs: 2.8 10*3/uL (ref 1.7–7.7)
Neutrophils Relative %: 68 % (ref 43–77)
PLATELETS: 176 10*3/uL (ref 150–400)
RBC: 4.64 MIL/uL (ref 4.22–5.81)
RDW: 12.9 % (ref 11.5–15.5)
WBC: 4.1 10*3/uL (ref 4.0–10.5)

## 2015-04-20 LAB — COMPREHENSIVE METABOLIC PANEL
ALT: 24 U/L (ref 9–46)
AST: 30 U/L (ref 10–35)
Albumin: 4.6 g/dL (ref 3.6–5.1)
Alkaline Phosphatase: 83 U/L (ref 40–115)
BUN: 9 mg/dL (ref 7–25)
CHLORIDE: 103 mmol/L (ref 98–110)
CO2: 25 mmol/L (ref 20–31)
Calcium: 9.5 mg/dL (ref 8.6–10.3)
Creat: 0.76 mg/dL (ref 0.70–1.25)
GLUCOSE: 90 mg/dL (ref 65–99)
POTASSIUM: 4.4 mmol/L (ref 3.5–5.3)
Sodium: 139 mmol/L (ref 135–146)
TOTAL PROTEIN: 7 g/dL (ref 6.1–8.1)
Total Bilirubin: 0.6 mg/dL (ref 0.2–1.2)

## 2015-04-20 LAB — TSH: TSH: 0.064 u[IU]/mL — AB (ref 0.350–4.500)

## 2015-04-20 LAB — POCT URINALYSIS DIP (MANUAL ENTRY)
BILIRUBIN UA: NEGATIVE
BILIRUBIN UA: NEGATIVE
GLUCOSE UA: NEGATIVE
LEUKOCYTES UA: NEGATIVE
Nitrite, UA: NEGATIVE
PH UA: 5.5
Protein Ur, POC: NEGATIVE
Spec Grav, UA: 1.01
Urobilinogen, UA: 0.2

## 2015-04-20 LAB — HEMOGLOBIN A1C
HEMOGLOBIN A1C: 5.4 % (ref <5.7)
MEAN PLASMA GLUCOSE: 108 mg/dL (ref <117)

## 2015-04-20 LAB — HEPATITIS C ANTIBODY: HCV AB: NEGATIVE

## 2015-04-20 MED ORDER — VALACYCLOVIR HCL 500 MG PO TABS: 500.0000 mg | ORAL_TABLET | Freq: Three times a day (TID) | ORAL | Status: DC

## 2015-04-20 MED ORDER — HYOSCYAMINE SULFATE 0.125 MG PO TABS: 0.1250 mg | ORAL_TABLET | ORAL | Status: DC | PRN

## 2015-04-20 NOTE — Progress Notes (Signed)
Subjective:    Patient ID: Paul Sparks, male    DOB: Feb 01, 1951, 64 y.o.   MRN: [id number]  04/20/2015  Annual Exam and Medication Refill   HPI This 64 y.o. male presents for Complete Physical Examination.  Last physical:  04-17-14 Colonoscopy:  2015 TDAP:  2013 Pneumovax:  2015 Prevnar 13 Zostavax:  2013 Influenza:  2016 Eye exam:  Not done; 2008 Dental exam:  Every six months.  Weight loss: weight down 4 pounds; very stressful year; watches weight closely; also diligent on exercise.    Joint pain/arthralgias: knees, upper back, anterior chest pain/soreness; bones are really sore.  Movement and stretches helps with chest pain.  Sees chiropractor regularly; every few weeks; xrays of upper back, sternum/chest. No knee xrays recently. Known damaged meniscus.  No orthopedic evaluation recently.  Pain is not severe but is annoying.   Review of Systems  Constitutional: Positive for unexpected weight change. Negative for fever, chills, diaphoresis, activity change, appetite change and fatigue.  HENT: Negative for congestion, dental problem, drooling, ear discharge, ear pain, facial swelling, hearing loss, mouth sores, nosebleeds, postnasal drip, rhinorrhea, sinus pressure, sneezing, sore throat, tinnitus, trouble swallowing and voice change.   Eyes: Negative for photophobia, pain, discharge, redness, itching and visual disturbance.  Respiratory: Negative for apnea, cough, choking, chest tightness, shortness of breath, wheezing and stridor.   Cardiovascular: Negative for chest pain, palpitations and leg swelling.  Gastrointestinal: Negative for nausea, vomiting, abdominal pain, diarrhea, constipation and blood in stool.  Endocrine: Negative for cold intolerance, heat intolerance, polydipsia, polyphagia and polyuria.  Genitourinary: Negative for dysuria, urgency, frequency, hematuria, flank pain, decreased urine volume, discharge, penile swelling, scrotal swelling, enuresis, difficulty  urinating, genital sores, penile pain and testicular pain.  Musculoskeletal: Positive for back pain, arthralgias, neck pain and neck stiffness. Negative for myalgias, joint swelling and gait problem.  Skin: Negative for color change, pallor, rash and wound.  Allergic/Immunologic: Negative for environmental allergies, food allergies and immunocompromised state.  Neurological: Negative for dizziness, tremors, seizures, syncope, facial asymmetry, speech difficulty, weakness, light-headedness, numbness and headaches.  Hematological: Negative for adenopathy. Does not bruise/bleed easily.  Psychiatric/Behavioral: Positive for sleep disturbance. Negative for suicidal ideas, hallucinations, behavioral problems, confusion, self-injury, dysphoric mood, decreased concentration and agitation. The patient is nervous/anxious. The patient is not hyperactive.     Past Medical History  Diagnosis Date  . Colon polyp 11/24/2013    colonoscopy [name]. Repeat in 5 years.  . IBD (inflammatory bowel disease)     diarrhea, abdominal cramping.  . Inguinal hernia     Bilateral; s/p repair.  . Prostate enlargement     mildly and asymptomatic  . Allergy   . Wears glasses   . Arthritis   . Eczema 12/11/10    [name].  . Seborrheic keratosis 12/11/10    [name]/Dermatology consult  . Dermatofibroma of back 12/11/10    [name].  . Genital herpes   . Anxiety    Past Surgical History  Procedure Laterality Date  . Inguinal hernia repair  2005    right  . Tonsillectomy    . Knee arthroscopy    . Colonoscopy  11/24/2013    four polyp. [name], MD.  Repeat in 5 years.  . Vasectomy     Allergies  Allergen Reactions  . Celebrex [Celecoxib]     REACTION: Hives  . Flexeril [Cyclobenzaprine Hcl]   . Nsaids   . Sulfa Drugs Cross Reactors    Current Outpatient Prescriptions  Medication  Sig Dispense Refill  . aspirin 81 MG tablet Take 81 mg by mouth daily.    . cromolyn  (NASALCROM) 5.2 MG/ACT nasal spray Place 1 spray into the nose 2 (two) times daily.      . Glucosamine HCl (GLUCOSAMINE PO) Take by mouth daily.    Marland Kitchen loratadine (CLARITIN) 10 MG tablet Take 10 mg by mouth daily.      . minoxidil (ROGAINE) 2 % external solution Apply topically 2 (two) times daily.      . Multiple Vitamins-Minerals (MULTIVITAMIN WITH MINERALS) tablet Take 1 tablet by mouth daily.    Marland Kitchen OVER THE COUNTER MEDICATION ubiquinal 200mg  (co q 10)    . phenylephrine (SUDAFED PE) 10 MG TABS Take 10 mg by mouth every 4 (four) hours as needed.      . valACYclovir (VALTREX) 500 MG tablet Take 1 tablet (500 mg total) by mouth 3 (three) times daily. 30 tablet 3  . FLUZONE HIGH-DOSE 0.5 ML SUSY TO BE ADMINISTERED BY PHARMACIST FOR IMMUNIZATION  0  . hyoscyamine (LEVSIN, ANASPAZ) 0.125 MG tablet Take 1 tablet (0.125 mg total) by mouth every 4 (four) hours as needed. 60 tablet 5   No current facility-administered medications for this visit.   Social History   Social History  . Marital Status: Married    Spouse Name: N/A  . Number of Children: N/A  . Years of Education: N/A   Occupational History  . web development    Social History Main Topics  . Smoking status: Never Smoker   . Smokeless tobacco: Never Used  . Alcohol Use: 12.6 oz/week    21 Glasses of wine per week     Comment: 3/day   . Drug Use: No  . Sexual Activity: Yes    Birth Control/ Protection: None   Other Topics Concern  . Not on file   Social History Narrative   Marital status:  Married x 6 years; third marriage; happily married.      Children: two children (26, 36); no grandchildren.  Several step grandchildren (9).        Lives: with wife.      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  Plans to retire 05/2015.      Tobacco: none      Alcohol: three daily (wine and beer).  Very rare intoxication.      Drugs: none      Exercise:  Walks daily for 20 minutes; walks  dog regularly.      Seatbelt: 100% of time      Guns in home: none      Sunscreen: yes; rare sun exposure.      ADLs: independent with ADLs.   Family History  Problem Relation Age of Onset  . Dementia Mother     Multi-infarct dementia  . Stroke Mother   . Diabetes Father   . Colon cancer Neg Hx   . Pancreatic cancer Neg Hx   . Rectal cancer Neg Hx   . Stomach cancer Neg Hx   . Cancer Paternal Grandfather     lung       Objective:    BP 111/70 mmHg  Pulse 82  Temp(Src) 98.6 F (37 C) (Oral)  Resp 16  Ht 5' 11.25" (1.81 m)  Wt 131 lb 12.8 oz (59.784 kg)  BMI 18.25 kg/m2 Physical Exam  Constitutional: He is oriented to person, place, and time. He appears well-developed and  well-nourished. No distress.  HENT:  Head: Normocephalic and atraumatic.  Right Ear: External ear normal.  Left Ear: External ear normal.  Nose: Nose normal.  Mouth/Throat: Oropharynx is clear and moist.  Eyes: Conjunctivae and EOM are normal. Pupils are equal, round, and reactive to light.  Neck: Normal range of motion. Neck supple. Carotid bruit is not present. No thyromegaly present.  Cardiovascular: Normal rate, regular rhythm, normal heart sounds and intact distal pulses.  Exam reveals no gallop and no friction rub.   No murmur heard. Pulmonary/Chest: Effort normal and breath sounds normal. He has no wheezes. He has no rales.  Abdominal: Soft. Bowel sounds are normal. He exhibits no distension and no mass. There is no tenderness. There is no rebound and no guarding. Hernia confirmed negative in the right inguinal area and confirmed negative in the left inguinal area.  Genitourinary: Rectum normal, testes normal and penis normal. Prostate is enlarged. Circumcised.  Musculoskeletal:       Right shoulder: Normal.       Left shoulder: Normal.       Cervical back: Normal.  Lymphadenopathy:    He has no cervical adenopathy.       Right: No inguinal adenopathy present.       Left: No inguinal  adenopathy present.  Neurological: He is alert and oriented to person, place, and time. He has normal reflexes. No cranial nerve deficit. He exhibits normal muscle tone. Coordination normal.  Skin: Skin is warm and dry. No rash noted. He is not diaphoretic.  Psychiatric: He has a normal mood and affect. His behavior is normal. Judgment and thought content normal.        Assessment & Plan:   1. Routine physical examination   2. Pure hypercholesterolemia   3. Leukocytopenia   4. Screening for diabetes mellitus   5. Need for hepatitis C screening test   6. Need for prophylactic vaccination against Streptococcus pneumoniae (pneumococcus)   7. Irritable bowel syndrome with diarrhea   8. Weight loss   9. Underweight   10. Prostate enlargement   11. Other seasonal allergic rhinitis     1.  Complete Physical Examination:  Anticipatory guidance ---- weight gain, exercise, ASA 81mg  daily.  S/p Pneumovax in office.  Immunizations UTD. Colonsocopy UTD. 2.  Hypercholesterolemia:  Stable; obtain labs. 3.  Leukocytopenia: stable; obtain labs; chronic; asymptomatic other than weight loss that is stress related. HIV negative in last year. 4.  Screening DMII: obtain labs. 5.  Screening Hepatitis C: obtain labs. 6. S/p Pneumovax. 7.  IBS: stable; colonoscopy UTD; refill of hyoscyamine provided. 8.  Weight loss: secondary to stress from work; retiring in one month; encourage increased intake; very weight conscious. 9. Underweight: with weight loss; encourage increased caloric intake per dya. 10.   Allergic Rhinitis: stable; chronic.   11. Stress reaction/anxiety: worsening in past year secondary to work stressors; retiring in one month. Pt expects anxiety to improve. 12. HSV genital: refill of Valtrex provided. 13.  DDD lumbar and cervical: stable; followed by chiropractor closely; does not desire orthopedic consultation because does not desire any surgical intervention.  No radicular  symptoms.   Orders Placed This Encounter  Procedures  . Pneumococcal polysaccharide vaccine 23-valent greater than or equal to 2yo subcutaneous/IM  . CBC with Differential/Platelet  . Comprehensive metabolic panel    Order Specific Question:  Has the patient fasted?    Answer:  Yes  . Hemoglobin A1c  . Lipid panel    Order Specific  Question:  Has the patient fasted?    Answer:  Yes  . TSH  . Hepatitis C antibody  . POCT urinalysis dipstick   Meds ordered this encounter  Medications  . FLUZONE HIGH-DOSE 0.5 ML SUSY    Sig: TO BE ADMINISTERED BY PHARMACIST FOR IMMUNIZATION    Refill:  0  . valACYclovir (VALTREX) 500 MG tablet    Sig: Take 1 tablet (500 mg total) by mouth 3 (three) times daily.    Dispense:  30 tablet    Refill:  3  . hyoscyamine (LEVSIN, ANASPAZ) 0.125 MG tablet    Sig: Take 1 tablet (0.125 mg total) by mouth every 4 (four) hours as needed.    Dispense:  60 tablet    Refill:  5    Return in about 1 year (around 04/19/2016) for complete physical examiniation.   Kristi Elayne Guerin, M.D. [facility] [address] [place], [place]  [address] [phone] phone [phone] fax

## 2015-04-20 NOTE — Patient Instructions (Signed)

## 2015-05-01 ENCOUNTER — Telehealth: Payer: Self-pay

## 2015-05-01 NOTE — Telephone Encounter (Signed)
Dr. Tamala Julian do you recall?

## 2015-05-01 NOTE — Telephone Encounter (Signed)
Patient states that during his last office visit the provider offered to prescribe a foam hemroid medication. Patient states that they have gotten worse and now he needs the medication. [facility]acy is [facility] [address]

## 2015-05-03 MED ORDER — HYDROCORTISONE ACE-PRAMOXINE 1-1 % RE FOAM: 1.0000 | Freq: Two times a day (BID) | RECTAL | Status: DC

## 2015-05-03 NOTE — Telephone Encounter (Signed)
Pt.notified

## 2015-05-03 NOTE — Telephone Encounter (Signed)
Prescription sent in; please advise patient.

## 2015-05-08 NOTE — Addendum Note (Signed)
Addended by: Wardell Honour on: 05/08/2015 09:17 AM   Modules accepted: Orders

## 2015-06-19 ENCOUNTER — Other Ambulatory Visit (INDEPENDENT_AMBULATORY_CARE_PROVIDER_SITE_OTHER): Payer: 59

## 2015-06-19 DIAGNOSIS — R946 Abnormal results of thyroid function studies: Secondary | ICD-10-CM | POA: Diagnosis not present

## 2015-06-19 LAB — TSH: TSH: 0.681 u[IU]/mL (ref 0.350–4.500)

## 2015-06-19 LAB — T4, FREE: FREE T4: 1.02 ng/dL (ref 0.80–1.80)

## 2015-07-30 DIAGNOSIS — Z0100 Encounter for examination of eyes and vision without abnormal findings: Secondary | ICD-10-CM | POA: Diagnosis not present

## 2016-01-09 ENCOUNTER — Other Ambulatory Visit: Payer: Self-pay | Admitting: Family Medicine

## 2016-02-13 ENCOUNTER — Ambulatory Visit (INDEPENDENT_AMBULATORY_CARE_PROVIDER_SITE_OTHER): Payer: 59 | Admitting: Physician Assistant

## 2016-02-13 VITALS — BP 122/72 | HR 100 | Temp 98.1°F | Resp 17 | Ht 71.0 in | Wt 134.0 lb

## 2016-02-13 DIAGNOSIS — Z23 Encounter for immunization: Secondary | ICD-10-CM

## 2016-02-13 DIAGNOSIS — R300 Dysuria: Secondary | ICD-10-CM

## 2016-02-13 DIAGNOSIS — N410 Acute prostatitis: Secondary | ICD-10-CM | POA: Diagnosis not present

## 2016-02-13 LAB — POCT URINALYSIS DIP (MANUAL ENTRY)
BILIRUBIN UA: NEGATIVE
Bilirubin, UA: NEGATIVE
Blood, UA: NEGATIVE
Glucose, UA: NEGATIVE
LEUKOCYTES UA: NEGATIVE
NITRITE UA: NEGATIVE
PH UA: 7
PROTEIN UA: NEGATIVE
Spec Grav, UA: 1.01
UROBILINOGEN UA: 0.2

## 2016-02-13 LAB — POC MICROSCOPIC URINALYSIS (UMFC): Mucus: ABSENT

## 2016-02-13 MED ORDER — CIPROFLOXACIN HCL 500 MG PO TABS: 500.0000 mg | ORAL_TABLET | Freq: Two times a day (BID) | ORAL | 1 refills | Status: AC

## 2016-02-13 NOTE — Patient Instructions (Addendum)
Please take a 10 day course of Cipro. If you are not better refill it and take the rest of the course.  RTC if your symptoms do not improve/worsen.     IF you received an x-ray today, you will receive an invoice from [facility]. Please contact [facility] at [phone] with questions or concerns regarding your invoice.   IF you received labwork today, you will receive an invoice from [facility]. Please contact [facility] at [phone] with questions or concerns regarding your invoice.   Our billing staff will not be able to assist you with questions regarding bills from these companies.  You will be contacted with the lab results as soon as they are available. The fastest way to get your results is to activate your My Chart account. Instructions are located on the last page of this paperwork. If you have not heard from [place] regarding the results in 2 weeks, please contact this office.

## 2016-02-13 NOTE — Progress Notes (Signed)
Paul Sparks  MRN: [id number] DOB: 12/21/1950  PCP: Reginia Forts, MD  Subjective:  Pt is a pleasant 65 year old man, history of genital herpes, GERD and IBS, who presents to clinic for lower abdominal pain x one month. He has a difficult time describing the location of the pain, stating it involves the front and back of his lower abdomen. Also noted associated pain in his penis, cannot describe specific location of pain. Occasional pain with urination, decreased urine, increased frequency. Reports no fevers, but his wife said he felt hot recently.  No history of prostatitis.  Denies blood in his urine, pain with sex, difficulty with ejaculation, fever, chills, nausea, vomiting, pain in his perineal area.    Review of Systems  Constitutional: Negative for chills, diaphoresis, fatigue and fever.  Cardiovascular: Negative.   Gastrointestinal: Positive for abdominal pain. Negative for nausea and rectal pain.  Genitourinary: Positive for decreased urine volume, difficulty urinating, frequency and penile pain. Negative for discharge, flank pain, penile swelling, scrotal swelling, testicular pain and urgency.  Musculoskeletal: Positive for back pain.    Patient Active Problem List   Diagnosis Date Noted  . Leukocytopenia 04/20/2015  . Genital herpes 04/17/2014  . Leukocytopenia, unspecified 08/02/2012  . Other malaise and fatigue 08/02/2012  . Degenerative disc disease, lumbar 08/02/2012  . Degenerative disc disease, cervical 08/02/2012  . Anemia 04/27/2012  . Folate deficiency 04/27/2012  . Pure hypercholesterolemia 04/27/2012  . Routine general medical examination at a health care facility 03/24/2012  . Irritable bowel syndrome with diarrhea 03/24/2012  . Skin lesion 03/24/2012  . Allergic rhinitis 03/24/2012  . Inguinal hernia 12/10/2010  . Prostate enlargement   . GERD 09/21/2008  . ABDOMINAL PAIN OTHER SPECIFIED SITE 09/21/2008    Current Outpatient Prescriptions on File  Prior to Visit  Medication Sig Dispense Refill  . aspirin 81 MG tablet Take 81 mg by mouth daily.    . cromolyn (NASALCROM) 5.2 MG/ACT nasal spray Place 1 spray into the nose 2 (two) times daily.      Marland Kitchen FLUZONE HIGH-DOSE 0.5 ML SUSY TO BE ADMINISTERED BY PHARMACIST FOR IMMUNIZATION  0  . Glucosamine HCl (GLUCOSAMINE PO) Take by mouth daily.    . hydrocortisone-pramoxine (PROCTOFOAM-HC) rectal foam Place 1 applicator rectally 2 (two) times daily. 10 g 3  . hyoscyamine (LEVSIN, ANASPAZ) 0.125 MG tablet Take 1 tablet (0.125 mg total) by mouth every 4 (four) hours as needed. 60 tablet 5  . loratadine (CLARITIN) 10 MG tablet Take 10 mg by mouth daily.      . minoxidil (ROGAINE) 2 % external solution Apply topically 2 (two) times daily.      . Multiple Vitamins-Minerals (MULTIVITAMIN WITH MINERALS) tablet Take 1 tablet by mouth daily.    Marland Kitchen OVER THE COUNTER MEDICATION ubiquinal 200mg  (co q 10)    . phenylephrine (SUDAFED PE) 10 MG TABS Take 10 mg by mouth every 4 (four) hours as needed.      . valACYclovir (VALTREX) 500 MG tablet TAKE 1 TABLET BY MOUTH 3 TIMES A DAY 30 tablet 3   No current facility-administered medications on file prior to visit.     Allergies  Allergen Reactions  . Celebrex [Celecoxib]     REACTION: Hives  . Flexeril [Cyclobenzaprine Hcl]   . Nsaids   . Sulfa Drugs Cross Reactors     Objective:  BP 122/72 (BP Location: Right Arm, Patient Position: Sitting, Cuff Size: Normal)   Pulse 100   Temp 98.1 F (36.7  C) (Oral)   Resp 17   Ht 5\' 11"  (1.803 m)   Wt 134 lb (60.8 kg)   SpO2 100%   BMI 18.69 kg/m   Physical Exam  Constitutional: He is oriented to person, place, and time and well-developed, well-nourished, and in no distress. No distress.  Cardiovascular: Normal rate, regular rhythm and normal heart sounds.   Pulmonary/Chest: Effort normal. No respiratory distress.  Neurological: He is alert and oriented to person, place, and time. GCS score is 15.  Skin: Skin  is warm and dry.  Psychiatric: Mood, memory, affect and judgment normal.  Vitals reviewed.   Assessment and Plan :  1. Acute prostatitis 2. Dysuria - Discussed with patient likelihood of prostatitis, even though his urine is clean. Will treat empirically with 10 days of Cipro, if his symptoms have not resolved at that time, patient is instructed to refill and take another 10 day course. He understands and agrees.  - ciprofloxacin (CIPRO) 500 MG tablet; Take 1 tablet (500 mg total) by mouth 2 (two) times daily.  Dispense: 20 tablet; Refill: 1 - POCT Microscopic Urinalysis ([facility]) - POCT urinalysis dipstick - Urine culture - PSA  3. Needs flu shot - Flu Vaccine QUAD 36+ mos IM   Mercer Pod, PA-C  [facility] 02/13/2016 12:44 PM

## 2016-02-14 LAB — URINE CULTURE: Organism ID, Bacteria: NO GROWTH

## 2016-02-14 LAB — PSA: PSA: 6.5 ng/mL — ABNORMAL HIGH (ref <=4.0)

## 2016-02-15 NOTE — Progress Notes (Signed)
Please call patient and let him know his PSA level is elevated. As discussed in clinic, this indicates prostatitis. Continue treatment with plan.

## 2016-04-12 ENCOUNTER — Ambulatory Visit (INDEPENDENT_AMBULATORY_CARE_PROVIDER_SITE_OTHER): Payer: 59 | Admitting: Family Medicine

## 2016-04-12 VITALS — BP 110/60 | HR 93 | Temp 98.3°F | Resp 18 | Ht 71.0 in | Wt 136.0 lb

## 2016-04-12 DIAGNOSIS — N5089 Other specified disorders of the male genital organs: Secondary | ICD-10-CM | POA: Diagnosis not present

## 2016-04-12 DIAGNOSIS — N5082 Scrotal pain: Secondary | ICD-10-CM

## 2016-04-12 NOTE — Progress Notes (Signed)
Subjective:    Patient ID: JOANTHONY TETTER, male    DOB: 02/15/1951, 65 y.o.   MRN: [id number]  04/12/2016  Testicle Pain (and swelling. Hasn't noticed any discharge. First noticed Oct. 14th. Right side. )   HPI This 65 y.o. male presents for evaluation of R scrotal swelling and pain for three weeks.  Sitting on the couch on 03/15/16 and stood up and developed seering pain in R testicle; felt a tearing sensation.  Suffered moderate pain in R scrotum for 10 days after event; unable to palpate area due to pain.  After ten days, able to exam scrotum R.  Noticed moderate swelling and unable to palpate typical varicosities in the R scrotum.  Had RN wife examine scrotum and she also appreciated swelling.  Presenting for evaluation today.  No persistent pain in R scrotum.  +persistent swelling. Denies dysuria, hematuria, urgency, frequency, penile discharge, fever/chills/sweats, nausea, vomiting, abdominal pain.    S/p evaluation 02/13/16 for prostatitis; required 20 days of Cipro therapy; PSA elevated to 6.5 at that visit.  Symptoms have resolved; has CPE scheduled later in month.   Review of Systems  Constitutional: Negative for activity change, appetite change, chills, diaphoresis, fatigue and fever.  Eyes: Negative for visual disturbance.  Respiratory: Negative for cough and shortness of breath.   Cardiovascular: Negative for chest pain, palpitations and leg swelling.  Gastrointestinal: Negative for abdominal pain, constipation, diarrhea, nausea and vomiting.  Endocrine: Negative for cold intolerance, heat intolerance, polydipsia, polyphagia and polyuria.  Genitourinary: Positive for scrotal swelling and testicular pain. Negative for decreased urine volume, difficulty urinating, dysuria, enuresis, flank pain, frequency, genital sores, hematuria, penile pain, penile swelling and urgency.  Neurological: Negative for dizziness, tremors, seizures, syncope, facial asymmetry, speech difficulty,  weakness, light-headedness, numbness and headaches.    Past Medical History:  Diagnosis Date  . Allergy   . Anxiety   . Arthritis   . Colon polyp 11/24/2013   colonoscopy [name]. Repeat in 5 years.  . Dermatofibroma of back 12/11/10   [name].  . Eczema 12/11/10   [name].  . Genital herpes   . IBD (inflammatory bowel disease)    diarrhea, abdominal cramping.  . Inguinal hernia    Bilateral; s/p repair.  . Prostate enlargement    mildly and asymptomatic  . Seborrheic keratosis 12/11/10   [name]/Dermatology consult  . Wears glasses    Past Surgical History:  Procedure Laterality Date  . COLONOSCOPY  11/24/2013   four polyp. [name], MD.  Repeat in 5 years.  . INGUINAL HERNIA REPAIR  2005   right  . KNEE ARTHROSCOPY    . TONSILLECTOMY    . VASECTOMY     Allergies  Allergen Reactions  . Celebrex [Celecoxib]     REACTION: Hives  . Flexeril [Cyclobenzaprine Hcl]   . Nsaids   . Sulfa Drugs Cross Reactors    Current Outpatient Prescriptions  Medication Sig Dispense Refill  . aspirin 81 MG tablet Take 81 mg by mouth daily.    . cromolyn (NASALCROM) 5.2 MG/ACT nasal spray Place 1 spray into the nose 2 (two) times daily.      Marland Kitchen FLUZONE HIGH-DOSE 0.5 ML SUSY TO BE ADMINISTERED BY PHARMACIST FOR IMMUNIZATION  0  . Glucosamine HCl (GLUCOSAMINE PO) Take by mouth daily.    . hydrocortisone-pramoxine (PROCTOFOAM-HC) rectal foam Place 1 applicator rectally 2 (two) times daily. 10 g 3  . hyoscyamine (LEVSIN, ANASPAZ) 0.125 MG tablet Take 1 tablet (0.125 mg total) by  mouth every 4 (four) hours as needed. 60 tablet 5  . loratadine (CLARITIN) 10 MG tablet Take 10 mg by mouth daily.      . minoxidil (ROGAINE) 2 % external solution Apply topically 2 (two) times daily.      . Multiple Vitamins-Minerals (MULTIVITAMIN WITH MINERALS) tablet Take 1 tablet by mouth daily.    Marland Kitchen OVER THE COUNTER MEDICATION ubiquinal 200mg  (co q 10)    . phenazopyridine  (PYRIDIUM) 100 MG tablet Take 100 mg by mouth 3 (three) times daily as needed for pain.    . phenylephrine (SUDAFED PE) 10 MG TABS Take 10 mg by mouth every 4 (four) hours as needed.      . valACYclovir (VALTREX) 500 MG tablet TAKE 1 TABLET BY MOUTH 3 TIMES A DAY 30 tablet 3   No current facility-administered medications for this visit.    Social History   Social History  . Marital status: Married    Spouse name: N/A  . Number of children: N/A  . Years of education: N/A   Occupational History  . web development    Social History Main Topics  . Smoking status: Never Smoker  . Smokeless tobacco: Never Used  . Alcohol use 12.6 oz/week    21 Glasses of wine per week     Comment: 3/day   . Drug use: No  . Sexual activity: Yes    Birth control/ protection: None   Other Topics Concern  . Not on file   Social History Narrative   Marital status:  Married x 6 years; third marriage; happily married.      Children: two children (26, 25); no grandchildren.  Several step grandchildren (9).        Lives: with wife.      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  Plans to retire 05/2015.      Tobacco: none      Alcohol: three daily (wine and beer).  Very rare intoxication.      Drugs: none      Exercise:  Walks daily for 20 minutes; walks dog regularly.      Seatbelt: 100% of time      Guns in home: none      Sunscreen: yes; rare sun exposure.      ADLs: independent with ADLs.   Family History  Problem Relation Age of Onset  . Dementia Mother     Multi-infarct dementia  . Stroke Mother   . Diabetes Father   . Colon cancer Neg Hx   . Pancreatic cancer Neg Hx   . Rectal cancer Neg Hx   . Stomach cancer Neg Hx   . Cancer Paternal Grandfather     lung       Objective:    BP 110/60   Pulse 93   Temp 98.3 F (36.8 C) (Oral)   Resp 18   Ht 5\' 11"  (1.803 m)   Wt 136 lb (61.7 kg)   SpO2 (!) 17%   BMI 18.97 kg/m  Physical Exam   Constitutional: He is oriented to person, place, and time. He appears well-developed and well-nourished. No distress.  HENT:  Head: Normocephalic and atraumatic.  Eyes: Conjunctivae and EOM are normal. Pupils are equal, round, and reactive to light.  Neck: Normal range of motion. Neck supple. Carotid bruit is not present.  Cardiovascular: Normal rate, regular rhythm, normal heart sounds and intact distal pulses.  Exam reveals  no gallop and no friction rub.   No murmur heard. Pulmonary/Chest: Effort normal and breath sounds normal. He has no wheezes. He has no rales.  Abdominal: Soft. Bowel sounds are normal. He exhibits no distension and no mass. There is no tenderness. There is no rebound and no guarding. Hernia confirmed negative in the right inguinal area and confirmed negative in the left inguinal area.  Genitourinary: Penis normal. Right testis shows mass. Right testis shows no swelling and no tenderness. Right testis is descended. Left testis shows no mass, no swelling and no tenderness. Left testis is descended.  Genitourinary Comments: R nodule inferior aspect of R testicle non-tender 1 cm diameter.  No erythema of scrotum.  Lymphadenopathy:       Right: No inguinal adenopathy present.       Left: No inguinal adenopathy present.  Neurological: He is alert and oriented to person, place, and time. No cranial nerve deficit.  Skin: Skin is warm and dry. No rash noted. He is not diaphoretic.  Psychiatric: He has a normal mood and affect. His behavior is normal.  Nursing note and vitals reviewed.  Results for orders placed or performed in visit on 02/13/16  Urine culture  Result Value Ref Range   Organism ID, Bacteria NO GROWTH   PSA  Result Value Ref Range   PSA 6.5 (H) <=4.0 ng/mL  POCT Microscopic Urinalysis ([facility])  Result Value Ref Range   WBC,UR,HPF,POC None None WBC/hpf   RBC,UR,HPF,POC None None RBC/hpf   Bacteria None None, Too numerous to count   Mucus Absent Absent    Epithelial Cells, UR Per Microscopy None None, Too numerous to count cells/hpf  POCT urinalysis dipstick  Result Value Ref Range   Color, UA yellow yellow   Clarity, UA clear clear   Glucose, UA negative negative   Bilirubin, UA negative negative   Ketones, POC UA negative negative   Spec Grav, UA 1.010    Blood, UA negative negative   pH, UA 7.0    Protein Ur, POC negative negative   Urobilinogen, UA 0.2    Nitrite, UA Negative Negative   Leukocytes, UA Negative Negative       Assessment & Plan:   1. Scrotal pain   2. Testicular nodule    -New; onset 3-4 weeks ago with standing. -refer for scrotal US to evaluate nodule inferior aspect of testicle.   Orders Placed This Encounter  Procedures  . US Scrotum    Standing Status:   Future    Standing Expiration Date:   06/12/2017    Order Specific Question:   Reason for Exam (SYMPTOM  OR DIAGNOSIS REQUIRED)    Answer:   R scrotal pain for one month; nodule inferior aspect of testicle    Order Specific Question:   Preferred imaging location?    Answer:   [id number]   No orders of the defined types were placed in this encounter.   No Follow-up on file.   Jaquayla Hege Elayne Guerin, M.D. [facility] [address] [place], [place]  [address] [phone] phone [phone] fax

## 2016-04-12 NOTE — Patient Instructions (Signed)
     IF you received an x-ray today, you will receive an invoice from [facility]. Please contact [facility] at [phone] with questions or concerns regarding your invoice.   IF you received labwork today, you will receive an invoice from [facility]. Please contact [facility] at [phone] with questions or concerns regarding your invoice.   Our billing staff will not be able to assist you with questions regarding bills from these companies.  You will be contacted with the lab results as soon as they are available. The fastest way to get your results is to activate your My Chart account. Instructions are located on the last page of this paperwork. If you have not heard from us regarding the results in 2 weeks, please contact this office.      

## 2016-04-15 ENCOUNTER — Telehealth: Payer: Self-pay

## 2016-04-15 NOTE — Telephone Encounter (Signed)
[facility] called -- need to add US doppler before imaging can be schd. Thanks!

## 2016-04-16 ENCOUNTER — Other Ambulatory Visit: Payer: Self-pay

## 2016-04-16 ENCOUNTER — Encounter: Payer: Self-pay | Admitting: Family Medicine

## 2016-04-16 DIAGNOSIS — N5082 Scrotal pain: Secondary | ICD-10-CM

## 2016-04-16 NOTE — Telephone Encounter (Signed)
Please add US doppler so imaging can be completed.

## 2016-04-17 NOTE — Telephone Encounter (Signed)
I called [facility] to see what order needed to be put in and they reported that it has been added and pt is scheduled for Tues the 21st.

## 2016-04-17 NOTE — Telephone Encounter (Signed)
Please call [facility] to confirm.  I am evaluating the testicles/scrotum.  I ordered a US scrotum.  I do not see an order in [facility] for US doppler scrotum or testicles.

## 2016-04-22 ENCOUNTER — Ambulatory Visit
Admission: RE | Admit: 2016-04-22 | Discharge: 2016-04-22 | Disposition: A | Discharge status: Home / Self Care | Payer: 59 | Source: Ambulatory Visit | Attending: Family Medicine | Admitting: Family Medicine

## 2016-04-22 DIAGNOSIS — N5089 Other specified disorders of the male genital organs: Secondary | ICD-10-CM | POA: Diagnosis not present

## 2016-04-22 DIAGNOSIS — N5082 Scrotal pain: Secondary | ICD-10-CM

## 2016-04-28 ENCOUNTER — Encounter: Payer: BLUE CROSS/BLUE SHIELD | Admitting: Family Medicine

## 2016-04-29 ENCOUNTER — Encounter: Payer: Self-pay | Admitting: Family Medicine

## 2016-04-29 ENCOUNTER — Ambulatory Visit (INDEPENDENT_AMBULATORY_CARE_PROVIDER_SITE_OTHER): Payer: 59 | Admitting: Family Medicine

## 2016-04-29 VITALS — BP 110/68 | HR 76 | Temp 98.0°F | Resp 16 | Ht 71.0 in | Wt 134.0 lb

## 2016-04-29 DIAGNOSIS — Z0000 Encounter for general adult medical examination without abnormal findings: Secondary | ICD-10-CM

## 2016-04-29 DIAGNOSIS — E538 Deficiency of other specified B group vitamins: Secondary | ICD-10-CM

## 2016-04-29 DIAGNOSIS — M5030 Other cervical disc degeneration, unspecified cervical region: Secondary | ICD-10-CM

## 2016-04-29 DIAGNOSIS — D708 Other neutropenia: Secondary | ICD-10-CM

## 2016-04-29 DIAGNOSIS — Z131 Encounter for screening for diabetes mellitus: Secondary | ICD-10-CM | POA: Diagnosis not present

## 2016-04-29 DIAGNOSIS — N5089 Other specified disorders of the male genital organs: Secondary | ICD-10-CM

## 2016-04-29 DIAGNOSIS — K580 Irritable bowel syndrome with diarrhea: Secondary | ICD-10-CM | POA: Diagnosis not present

## 2016-04-29 DIAGNOSIS — K219 Gastro-esophageal reflux disease without esophagitis: Secondary | ICD-10-CM

## 2016-04-29 DIAGNOSIS — M5136 Other intervertebral disc degeneration, lumbar region: Secondary | ICD-10-CM | POA: Diagnosis not present

## 2016-04-29 DIAGNOSIS — E7800 Pure hypercholesterolemia, unspecified: Secondary | ICD-10-CM

## 2016-04-29 DIAGNOSIS — J301 Allergic rhinitis due to pollen: Secondary | ICD-10-CM | POA: Diagnosis not present

## 2016-04-29 DIAGNOSIS — Z125 Encounter for screening for malignant neoplasm of prostate: Secondary | ICD-10-CM | POA: Diagnosis not present

## 2016-04-29 DIAGNOSIS — A6001 Herpesviral infection of penis: Secondary | ICD-10-CM

## 2016-04-29 LAB — LIPID PANEL
Cholesterol: 236 mg/dL — ABNORMAL HIGH (ref <200)
HDL: 63 mg/dL (ref >40)
LDL CALC: 148 mg/dL — AB (ref <100)
Total CHOL/HDL Ratio: 3.7 Ratio (ref <5.0)
Triglycerides: 126 mg/dL (ref <150)
VLDL: 25 mg/dL (ref <30)

## 2016-04-29 LAB — CBC WITH DIFFERENTIAL/PLATELET
BASOS ABS: 0 {cells}/uL (ref 0–200)
Basophils Relative: 0 %
EOS PCT: 3 %
Eosinophils Absolute: 108 cells/uL (ref 15–500)
HCT: 42.7 % (ref 38.5–50.0)
Hemoglobin: 14.4 g/dL (ref 13.2–17.1)
Lymphocytes Relative: 29 %
Lymphs Abs: 1044 cells/uL (ref 850–3900)
MCH: 32.2 pg (ref 27.0–33.0)
MCHC: 33.7 g/dL (ref 32.0–36.0)
MCV: 95.5 fL (ref 80.0–100.0)
MONOS PCT: 9 %
MPV: 10.6 fL (ref 7.5–12.5)
Monocytes Absolute: 324 cells/uL (ref 200–950)
NEUTROS ABS: 2124 {cells}/uL (ref 1500–7800)
Neutrophils Relative %: 59 %
PLATELETS: 150 10*3/uL (ref 140–400)
RBC: 4.47 MIL/uL (ref 4.20–5.80)
RDW: 13.2 % (ref 11.0–15.0)
WBC: 3.6 10*3/uL — ABNORMAL LOW (ref 3.8–10.8)

## 2016-04-29 LAB — TSH: TSH: 1.26 mIU/L (ref 0.40–4.50)

## 2016-04-29 LAB — POCT URINALYSIS DIP (MANUAL ENTRY)
BILIRUBIN UA: NEGATIVE
Bilirubin, UA: NEGATIVE
Blood, UA: NEGATIVE
Glucose, UA: NEGATIVE
NITRITE UA: NEGATIVE
PH UA: 6
PROTEIN UA: NEGATIVE
Spec Grav, UA: 1.01
Urobilinogen, UA: 0.2

## 2016-04-29 LAB — COMPREHENSIVE METABOLIC PANEL
ALT: 15 U/L (ref 9–46)
AST: 22 U/L (ref 10–35)
Albumin: 4.4 g/dL (ref 3.6–5.1)
Alkaline Phosphatase: 66 U/L (ref 40–115)
BUN: 14 mg/dL (ref 7–25)
CHLORIDE: 103 mmol/L (ref 98–110)
CO2: 30 mmol/L (ref 20–31)
CREATININE: 0.78 mg/dL (ref 0.70–1.25)
Calcium: 9.4 mg/dL (ref 8.6–10.3)
GLUCOSE: 99 mg/dL (ref 65–99)
Potassium: 4.4 mmol/L (ref 3.5–5.3)
SODIUM: 139 mmol/L (ref 135–146)
Total Bilirubin: 0.6 mg/dL (ref 0.2–1.2)
Total Protein: 6.9 g/dL (ref 6.1–8.1)

## 2016-04-29 LAB — PSA: PSA: 3.2 ng/mL (ref <=4.0)

## 2016-04-29 NOTE — Progress Notes (Signed)
Subjective:    Patient ID: Paul Sparks, male    DOB: 14-Nov-1950, 65 y.o.   MRN: [id number]  04/29/2016  Annual Exam   HPI This 65 y.o. male presents for Annual Wellness Examination/Complete Physical Examination.  Last physical: 04-20-15 Colonoscopy: 11-24-13 Eye exam:  2017; +early cataracts; no glaucoma; no md. Dental exam:  Every six months.  Microfractures due to eating ice; rare ice eating.  For years has eaten popcorn kernels.  Immunization History  Administered Date(s) Administered  . Influenza Split 11/15/2011, 01/31/2013  . Influenza,inj,Quad PF,36+ Mos 02/13/2016  . Influenza-Unspecified 02/24/2014  . Pneumococcal Conjugate-13 04/17/2014  . Pneumococcal Polysaccharide-23 04/20/2015  . Tdap 03/24/2012  . Zoster 05/02/2012   BP Readings from Last 3 Encounters:  04/29/16 110/68  04/12/16 110/60  02/13/16 122/72   Wt Readings from Last 3 Encounters:  04/29/16 134 lb (60.8 kg)  04/12/16 136 lb (61.7 kg)  02/13/16 134 lb (60.8 kg)    R scrotal swelling: much improved; no persistent pain; s/p scrotal US; presenting for results.   Review of Systems  Constitutional: Negative for activity change, appetite change, chills, diaphoresis, fatigue, fever and unexpected weight change.  HENT: Positive for hearing loss, rhinorrhea and tinnitus. Negative for congestion, dental problem, drooling, ear discharge, ear pain, facial swelling, mouth sores, nosebleeds, postnasal drip, sinus pressure, sneezing, sore throat, trouble swallowing and voice change.   Eyes: Negative for photophobia, pain, discharge, redness, itching and visual disturbance.  Respiratory: Negative for apnea, cough, choking, chest tightness, shortness of breath, wheezing and stridor.   Cardiovascular: Negative for chest pain, palpitations and leg swelling.  Gastrointestinal: Positive for diarrhea. Negative for abdominal distention, abdominal pain, anal bleeding, blood in stool, constipation, nausea, rectal pain  and vomiting.  Endocrine: Negative for cold intolerance, heat intolerance, polydipsia, polyphagia and polyuria.  Genitourinary: Negative for decreased urine volume, difficulty urinating, discharge, dysuria, enuresis, flank pain, frequency, genital sores, hematuria, penile pain, penile swelling, scrotal swelling, testicular pain and urgency.       Nocturia x highly variable 0-3.  Decreased urinary stream; does not need to sit to urinate; no straining.  Sex drive is decreased; +ED; no sustained erection.  Musculoskeletal: Positive for arthralgias, back pain, neck pain and neck stiffness. Negative for gait problem, joint swelling and myalgias.  Skin: Negative for color change, pallor, rash and wound.  Allergic/Immunologic: Negative for environmental allergies, food allergies and immunocompromised state.  Neurological: Positive for dizziness. Negative for tremors, seizures, syncope, facial asymmetry, speech difficulty, weakness, light-headedness, numbness and headaches.  Hematological: Negative for adenopathy. Does not bruise/bleed easily.  Psychiatric/Behavioral: Negative for agitation, behavioral problems, confusion, decreased concentration, dysphoric mood, hallucinations, self-injury, sleep disturbance and suicidal ideas. The patient is nervous/anxious. The patient is not hyperactive.        Bedtime 11:30pm; wakes up 6:30am.  Anxiety has improved in last year with retirement.    Past Medical History:  Diagnosis Date  . Allergy   . Anxiety   . Arthritis   . Colon polyp 11/24/2013   colonoscopy [name]. Repeat in 5 years.  . Dermatofibroma of back 12/11/10   [name].  . Eczema 12/11/10   [name].  . Genital herpes   . IBD (inflammatory bowel disease)    diarrhea, abdominal cramping.  . Inguinal hernia    Bilateral; s/p repair.  . Prostate enlargement    mildly and asymptomatic  . Seborrheic keratosis 12/11/10   [name]/Dermatology consult  . Wears glasses     Past Surgical History:  Procedure Laterality Date  . COLONOSCOPY  11/24/2013   four polyp. [name], MD.  Repeat in 5 years.  . INGUINAL HERNIA REPAIR  2005   right  . KNEE ARTHROSCOPY    . TONSILLECTOMY    . VASECTOMY     Allergies  Allergen Reactions  . Celebrex [Celecoxib]     REACTION: Hives  . Flexeril [Cyclobenzaprine Hcl]   . Nsaids   . Sulfa Drugs Cross Reactors    Current Outpatient Prescriptions  Medication Sig Dispense Refill  . aspirin 81 MG tablet Take 81 mg by mouth daily.    . cromolyn (NASALCROM) 5.2 MG/ACT nasal spray Place 1 spray into the nose 2 (two) times daily.      Marland Kitchen FLUZONE HIGH-DOSE 0.5 ML SUSY TO BE ADMINISTERED BY PHARMACIST FOR IMMUNIZATION  0  . hydrocortisone-pramoxine (PROCTOFOAM-HC) rectal foam Place 1 applicator rectally 2 (two) times daily. 10 g 3  . hyoscyamine (LEVSIN, ANASPAZ) 0.125 MG tablet Take 1 tablet (0.125 mg total) by mouth every 4 (four) hours as needed. 60 tablet 5  . loratadine (CLARITIN) 10 MG tablet Take 10 mg by mouth daily.      . minoxidil (ROGAINE) 2 % external solution Apply topically 2 (two) times daily.      . Multiple Vitamins-Minerals (MULTIVITAMIN WITH MINERALS) tablet Take 1 tablet by mouth daily.    Marland Kitchen OVER THE COUNTER MEDICATION ubiquinal 200mg  (co q 10)    . phenazopyridine (PYRIDIUM) 100 MG tablet Take 100 mg by mouth 3 (three) times daily as needed for pain.    . phenylephrine (SUDAFED PE) 10 MG TABS Take 10 mg by mouth every 4 (four) hours as needed.      . valACYclovir (VALTREX) 500 MG tablet TAKE 1 TABLET BY MOUTH 3 TIMES A DAY 30 tablet 3   No current facility-administered medications for this visit.    Social History   Social History  . Marital status: Married    Spouse name: N/A  . Number of children: 2  . Years of education: N/A   Occupational History  . 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 Smoking status: Never Smoker  . Smokeless tobacco: Never Used  .  Alcohol use 12.6 oz/week    21 Glasses of wine per week     Comment: 3/day   . Drug use: No  . Sexual activity: Yes    Birth control/ protection: Post-menopausal, Surgical   Other Topics Concern  . Not on file   Social History Narrative   Marital status:  Married x 7 years; third marriage; happily married.      Children: two children (27, 30); no grandchildren.  Several step grandchildren (9).        Lives: with wife.      Employment:  Retired 06/2015.  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        Tobacco: none      Alcohol: three daily (wine and beer).  Very rare intoxication.      Drugs: none      Exercise:  Walks daily for 20 minutes 1.3 miles per day; walks dog regularly.      Seatbelt: 100% of time      Guns in home: none      Sunscreen: yes; rare sun exposure.      ADLs: independent with ADLs. Drives      Advanced  Directives:  YES; living will; FULL CODE; no prolonged measures.     Family History  Problem Relation Age of Onset  . Dementia Mother     Multi-infarct dementia  . Stroke Mother   . Diabetes Father   . Cancer Paternal Grandfather     lung  . Colon cancer Neg Hx   . Pancreatic cancer Neg Hx   . Rectal cancer Neg Hx   . Stomach cancer Neg Hx        Objective:    BP 110/68 (BP Location: Right Arm, Patient Position: Sitting, Cuff Size: Normal)   Pulse 76   Temp 98 F (36.7 C) (Oral)   Resp 16   Ht 5\' 11"  (1.803 m)   Wt 134 lb (60.8 kg)   SpO2 100%   BMI 18.69 kg/m  Physical Exam  Constitutional: He is oriented to person, place, and time. He appears well-developed and well-nourished. No distress.  HENT:  Head: Normocephalic and atraumatic.  Right Ear: External ear normal.  Left Ear: External ear normal.  Nose: Nose normal.  Mouth/Throat: Oropharynx is clear and moist.  Eyes: Conjunctivae and EOM are normal. Pupils are equal, round, and reactive to light.  Neck: Normal range of motion. Neck supple. Carotid  bruit is not present. No thyromegaly present.  Cardiovascular: Normal rate, regular rhythm, normal heart sounds and intact distal pulses.  Exam reveals no gallop and no friction rub.   No murmur heard. Pulmonary/Chest: Effort normal and breath sounds normal. He has no wheezes. He has no rales.  Abdominal: Soft. Bowel sounds are normal. He exhibits no distension and no mass. There is no tenderness. There is no rebound and no guarding. Hernia confirmed negative in the right inguinal area and confirmed negative in the left inguinal area.  Genitourinary: Penis normal. Prostate is enlarged. Right testis shows mass. Right testis shows no tenderness. Left testis shows no mass and no tenderness.  Musculoskeletal:       Right shoulder: Normal.       Left shoulder: Normal.       Cervical back: Normal.  Lymphadenopathy:    He has no cervical adenopathy.       Right: No inguinal adenopathy present.       Left: No inguinal adenopathy present.  Neurological: He is alert and oriented to person, place, and time. He has normal reflexes. No cranial nerve deficit. He exhibits normal muscle tone. Coordination normal.  Skin: Skin is warm and dry. No rash noted. He is not diaphoretic.  Psychiatric: He has a normal mood and affect. His behavior is normal. Judgment and thought content normal.   Results for orders placed or performed in visit on 04/29/16  POCT urinalysis dipstick  Result Value Ref Range   Color, UA yellow yellow   Clarity, UA clear clear   Glucose, UA negative negative   Bilirubin, UA negative negative   Ketones, POC UA negative negative   Spec Grav, UA 1.010    Blood, UA negative negative   pH, UA 6.0    Protein Ur, POC negative negative   Urobilinogen, UA 0.2    Nitrite, UA Negative Negative   Leukocytes, UA Trace (A) Negative   Fall Risk  04/29/2016 04/12/2016 02/13/2016 04/20/2015 04/17/2014  Falls in the past year? No No No Yes No  Number falls in past yr: - - - 2 or more -  Injury  with Fall? - - - Yes -   Depression screen [facility] 2/9 04/29/2016 04/12/2016 02/13/2016  04/20/2015 04/17/2014  Decreased Interest 0 0 0 0 0  Down, Depressed, Hopeless 0 0 0 0 0  PHQ - 2 Score 0 0 0 0 0   Functional Status Survey: Is the patient deaf or have difficulty hearing?: No Does the patient have difficulty seeing, even when wearing glasses/contacts?: No Does the patient have difficulty concentrating, remembering, or making decisions?: No Does the patient have difficulty walking or climbing stairs?: No Does the patient have difficulty dressing or bathing?: No Does the patient have difficulty doing errands alone such as visiting a doctor's office or shopping?: No  GAD 7 : Generalized Anxiety Score 04/29/2016  Nervous, Anxious, on Edge 3  Control/stop worrying 0  Worry too much - different things 3  Trouble relaxing 3  Restless 0  Easily annoyed or irritable 0  Afraid - awful might happen 3  Total GAD 7 Score 12  Anxiety Difficulty Not difficult at all    US Scrotum  Result Date: 04/23/2016 CLINICAL DATA:  Palpable lump inferior in the right testicle, no current pain EXAM: ULTRASOUND OF SCROTUM TECHNIQUE: Complete ultrasound examination of the testicles, epididymis, and other scrotal structures was performed. COMPARISON:  None FINDINGS: Right testicle Measurements: The right testicle measures 4.9 x 2.0 x 3.4 cm. No intratesticular abnormality is seen. Blood flow is demonstrated to the right testicle with our chill and venous waveforms although the arterial waveform is somewhat limited. Left testicle Measurements: The left testicle measures 4.8 x 2.6 x 2.9 cm. No intratesticular abnormality is noted. Blood flow is demonstrated to the left testicle with arterial and venous waveforms. Right epididymis: Right epididymis is somewhat inhomogeneous and a 7 mm epididymal cyst is present. Left epididymis: The left epididymis is prominent which could indicate chronic epididymitis. No significant  increased blood flow is currently seen. Hydrocele:  Small amount of fluid is noted bilaterally. Varicocele:  No hydrocele is seen. The area palpation represents is somewhat inhomogeneous solid-appearing structure in the inferior scrotum which is separate from the testicle and may represent a sperm granuloma. IMPRESSION: 1. The area in question on palpates may represent a sperm granuloma being extratesticular and consistent with a benign process. 2. Blood flow is demonstrated to both testicles. 3. Small amount of fluid bilaterally. 4. Somewhat prominent left epididymis may represent chronic epididymitis. Electronically Signed   By: Ivar Drape M.D.   On: 04/23/2016 08:19   [place] Art/ven Flow Abd Pelv Doppler  Result Date: 04/23/2016 CLINICAL DATA:  Palpable lump inferior in the right testicle, no current pain EXAM: ULTRASOUND OF SCROTUM TECHNIQUE: Complete ultrasound examination of the testicles, epididymis, and other scrotal structures was performed. COMPARISON:  None FINDINGS: Right testicle Measurements: The right testicle measures 4.9 x 2.0 x 3.4 cm. No intratesticular abnormality is seen. Blood flow is demonstrated to the right testicle with our chill and venous waveforms although the arterial waveform is somewhat limited. Left testicle Measurements: The left testicle measures 4.8 x 2.6 x 2.9 cm. No intratesticular abnormality is noted. Blood flow is demonstrated to the left testicle with arterial and venous waveforms. Right epididymis: Right epididymis is somewhat inhomogeneous and a 7 mm epididymal cyst is present. Left epididymis: The left epididymis is prominent which could indicate chronic epididymitis. No significant increased blood flow is currently seen. Hydrocele:  Small amount of fluid is noted bilaterally. Varicocele:  No hydrocele is seen. The area palpation represents is somewhat inhomogeneous solid-appearing structure in the inferior scrotum which is separate from the testicle and may represent a  sperm  granuloma. IMPRESSION: 1. The area in question on palpates may represent a sperm granuloma being extratesticular and consistent with a benign process. 2. Blood flow is demonstrated to both testicles. 3. Small amount of fluid bilaterally. 4. Somewhat prominent left epididymis may represent chronic epididymitis. Electronically Signed   By: Ivar Drape M.D.   On: 04/23/2016 08:19      Assessment & Plan:   1. Routine physical examination   2. Chronic seasonal allergic rhinitis due to pollen   3. Gastroesophageal reflux disease without esophagitis   4. Irritable bowel syndrome with diarrhea   5. Degenerative disc disease, lumbar   6. Degenerative disc disease, cervical   7. Herpes simplex infection of penis   8. Folate deficiency   9. Pure hypercholesterolemia   10. Other neutropenia ([place])   11. Screening for prostate cancer   12. Screening for diabetes mellitus   13. Sperm granuloma    -anticipatory guidance --- exercise, weight gain. -obtain age appropriate screening labs. -immunizations UTD; colonoscopy UTD. -DDD cervical and lumbar spines impact quality of life the most; recommend updating spine films with chiropractor if no recent films; also recommend patient contacting me when desires referral to ortho. -R sperm granuloma detected on scrotal US; recommend repeating scrotal US in one year at next CPE; RTC for scrotal swelling or pain. -anxiety has improved since retirement yet still present; pt declines medication or counseling; continue exercise for stress management. -no changes to medications.   Orders Placed This Encounter  Procedures  . CBC with Differential/Platelet  . Comprehensive metabolic panel    Order Specific Question:   Has the patient fasted?    Answer:   Yes  . Lipid panel    Order Specific Question:   Has the patient fasted?    Answer:   Yes  . PSA  . Hemoglobin A1c  . TSH  . POCT urinalysis dipstick   No orders of the defined types were placed in  this encounter.   Return in about 1 year (around 04/29/2017) for complete physical examiniation.   Natarsha Hurwitz Elayne Guerin, M.D. [facility] [address] [place], [place]  [phone] phone [phone] fax

## 2016-04-29 NOTE — Patient Instructions (Addendum)
Please bring Dr. Tamala Julian a copy of your Living Will; also give a copy of the living will to your children.   IF you received an x-ray today, you will receive an invoice from [facility]. Please contact [facility] at [phone] with questions or concerns regarding your invoice.   IF you received labwork today, you will receive an invoice from [employer]. Please contact [facility] at [phone] with questions or concerns regarding your invoice.   Our billing staff will not be able to assist you with questions regarding bills from these companies.  You will be contacted with the lab results as soon as they are available. The fastest way to get your results is to activate your My Chart account. Instructions are located on the last page of this paperwork. If you have not heard from [place] regarding the results in 2 weeks, please contact this office.     Keeping you healthy  Get these tests  Blood pressure- Have your blood pressure checked once a year by your healthcare provider.  Normal blood pressure is 120/80  Weight- Have your body mass index (BMI) calculated to screen for obesity.  BMI is a measure of body fat based on height and weight. You can also calculate your own BMI at [employer]si.  Cholesterol- Have your cholesterol checked every year.  Diabetes- Have your blood sugar checked regularly if you have high blood pressure, high cholesterol, have a family history of diabetes or if you are overweight.  Screening for Colon Cancer- Colonoscopy starting at age 19.  Screening may begin sooner depending on your family history and other health conditions. Follow up colonoscopy as directed by your Gastroenterologist.  Screening for Prostate Cancer- Both blood work (PSA) and a rectal exam help screen for Prostate Cancer.  Screening begins at age 46 with African-American men and at age 35 with Caucasian men.  Screening may begin sooner  depending on your family history.  Take these medicines  Aspirin- One aspirin daily can help prevent Heart disease and Stroke.  Flu shot- Every fall.  Tetanus- Every 10 years.  Zostavax- Once after the age of 75 to prevent Shingles.  Pneumonia shot- Once after the age of 28; if you are younger than 75, ask your healthcare provider if you need a Pneumonia shot.  Take these steps  Don't smoke- If you do smoke, talk to your doctor about quitting.  For tips on how to quit, go to www.smokefree.gov or call 1-800-QUIT-NOW.  Be physically active- Exercise 5 days a week for at least 30 minutes.  If you are not already physically active start slow and gradually work up to 30 minutes of moderate physical activity.  Examples of moderate activity include walking briskly, mowing the yard, dancing, swimming, bicycling, etc.  Eat a healthy diet- Eat a variety of healthy food such as fruits, vegetables, low fat milk, low fat cheese, yogurt, lean meant, poultry, fish, beans, tofu, etc. For more information go to www.thenutritionsource.org  Drink alcohol in moderation- Limit alcohol intake to less than two drinks a day. Never drink and drive.  Dentist- Brush and floss twice daily; visit your dentist twice a year.  Depression- Your emotional health is as important as your physical health. If you're feeling down, or losing interest in things you would normally enjoy please talk to your healthcare provider.  Eye exam- Visit your eye doctor every year.  Safe sex- If you may be exposed to a sexually transmitted infection, use a condom.  Seat  belts- Seat belts can save your life; always wear one.  Smoke/Carbon Monoxide detectors- These detectors need to be installed on the appropriate level of your home.  Replace batteries at least once a year.  Skin cancer- When out in the sun, cover up and use sunscreen 15 SPF or higher.  Violence- If anyone is threatening you, please tell your healthcare  provider.  Living Will/ Health care power of attorney- Speak with your healthcare provider and family.

## 2016-04-30 LAB — HEMOGLOBIN A1C
Hgb A1c MFr Bld: 5.1 % (ref <5.7)
MEAN PLASMA GLUCOSE: 100 mg/dL

## 2016-06-26 DIAGNOSIS — M9901 Segmental and somatic dysfunction of cervical region: Secondary | ICD-10-CM | POA: Diagnosis not present

## 2016-06-26 DIAGNOSIS — M9906 Segmental and somatic dysfunction of lower extremity: Secondary | ICD-10-CM | POA: Diagnosis not present

## 2016-06-26 DIAGNOSIS — M9903 Segmental and somatic dysfunction of lumbar region: Secondary | ICD-10-CM | POA: Diagnosis not present

## 2016-06-26 DIAGNOSIS — M9902 Segmental and somatic dysfunction of thoracic region: Secondary | ICD-10-CM | POA: Diagnosis not present

## 2016-07-08 DIAGNOSIS — M9906 Segmental and somatic dysfunction of lower extremity: Secondary | ICD-10-CM | POA: Diagnosis not present

## 2016-07-08 DIAGNOSIS — M9903 Segmental and somatic dysfunction of lumbar region: Secondary | ICD-10-CM | POA: Diagnosis not present

## 2016-07-08 DIAGNOSIS — M9901 Segmental and somatic dysfunction of cervical region: Secondary | ICD-10-CM | POA: Diagnosis not present

## 2016-07-08 DIAGNOSIS — M9902 Segmental and somatic dysfunction of thoracic region: Secondary | ICD-10-CM | POA: Diagnosis not present

## 2017-02-24 DIAGNOSIS — Z23 Encounter for immunization: Secondary | ICD-10-CM | POA: Diagnosis not present

## 2017-10-10 ENCOUNTER — Ambulatory Visit (INDEPENDENT_AMBULATORY_CARE_PROVIDER_SITE_OTHER): Payer: 59 | Admitting: Family Medicine

## 2017-10-10 ENCOUNTER — Other Ambulatory Visit: Payer: Self-pay

## 2017-10-10 ENCOUNTER — Encounter: Payer: Self-pay | Admitting: Family Medicine

## 2017-10-10 VITALS — BP 102/78 | HR 76 | Temp 98.5°F | Resp 16 | Ht 71.65 in | Wt 130.0 lb

## 2017-10-10 DIAGNOSIS — E7800 Pure hypercholesterolemia, unspecified: Secondary | ICD-10-CM | POA: Diagnosis not present

## 2017-10-10 DIAGNOSIS — Z131 Encounter for screening for diabetes mellitus: Secondary | ICD-10-CM

## 2017-10-10 DIAGNOSIS — N400 Enlarged prostate without lower urinary tract symptoms: Secondary | ICD-10-CM | POA: Diagnosis not present

## 2017-10-10 DIAGNOSIS — R42 Dizziness and giddiness: Secondary | ICD-10-CM

## 2017-10-10 DIAGNOSIS — J0100 Acute maxillary sinusitis, unspecified: Secondary | ICD-10-CM

## 2017-10-10 DIAGNOSIS — M5030 Other cervical disc degeneration, unspecified cervical region: Secondary | ICD-10-CM

## 2017-10-10 DIAGNOSIS — K580 Irritable bowel syndrome with diarrhea: Secondary | ICD-10-CM

## 2017-10-10 DIAGNOSIS — K219 Gastro-esophageal reflux disease without esophagitis: Secondary | ICD-10-CM | POA: Diagnosis not present

## 2017-10-10 DIAGNOSIS — Z0000 Encounter for general adult medical examination without abnormal findings: Secondary | ICD-10-CM | POA: Diagnosis not present

## 2017-10-10 DIAGNOSIS — F419 Anxiety disorder, unspecified: Secondary | ICD-10-CM

## 2017-10-10 DIAGNOSIS — J301 Allergic rhinitis due to pollen: Secondary | ICD-10-CM | POA: Diagnosis not present

## 2017-10-10 DIAGNOSIS — F329 Major depressive disorder, single episode, unspecified: Secondary | ICD-10-CM

## 2017-10-10 LAB — POCT URINALYSIS DIP (MANUAL ENTRY)
BILIRUBIN UA: NEGATIVE mg/dL
Bilirubin, UA: NEGATIVE
Glucose, UA: NEGATIVE mg/dL
Leukocytes, UA: NEGATIVE
Nitrite, UA: NEGATIVE
PH UA: 5.5 (ref 5.0–8.0)
PROTEIN UA: NEGATIVE mg/dL
SPEC GRAV UA: 1.02 (ref 1.010–1.025)
UROBILINOGEN UA: 0.2 U/dL

## 2017-10-10 MED ORDER — AMOXICILLIN 500 MG PO TABS: 1000.0000 mg | ORAL_TABLET | Freq: Two times a day (BID) | ORAL | 0 refills | Status: DC

## 2017-10-10 MED ORDER — VALACYCLOVIR HCL 500 MG PO TABS: 500.0000 mg | ORAL_TABLET | Freq: Three times a day (TID) | ORAL | 3 refills | Status: DC

## 2017-10-10 MED ORDER — PREDNISONE 20 MG PO TABS: ORAL_TABLET | ORAL | 0 refills | Status: DC

## 2017-10-10 MED ORDER — HYOSCYAMINE SULFATE 0.125 MG PO TABS: 0.1250 mg | ORAL_TABLET | ORAL | 5 refills | Status: DC | PRN

## 2017-10-10 MED ORDER — ZOSTER VAC RECOMB ADJUVANTED 50 MCG/0.5ML IM SUSR: 0.5000 mL | Freq: Once | INTRAMUSCULAR | 1 refills | Status: AC

## 2017-10-10 NOTE — Progress Notes (Signed)
Subjective:    Patient ID: Paul Sparks, male    DOB: 10/01/1950, 67 y.o.   MRN: [id number]  10/10/2017  Annual Exam    HPI This 67 y.o. male presents for COMPLETE PHYSICAL EXAMINATION.  Last physical:  04-2016 Colonoscopy:  2015 PSA:  04-2016 Eye exam:  Once recently in past year; no return.  Paul Sparks. Dental exam:  Every six months.   Visual Acuity Screening   Right eye Left eye Both eyes  Without correction: 20/30 20/25 20/25   With correction:       BP Readings from Last 3 Encounters:  10/10/17 102/78  04/29/16 110/68  04/12/16 110/60   Wt Readings from Last 3 Encounters:  10/10/17 130 lb (59 kg)  04/29/16 134 lb (60.8 kg)  04/12/16 136 lb (61.7 kg)   Immunization History  Administered Date(s) Administered  . Influenza Split 11/15/2011, 01/31/2013  . Influenza,inj,Quad PF,6+ Mos 02/13/2016  . Influenza-Unspecified 02/24/2014, 02/24/2017  . Pneumococcal Conjugate-13 04/17/2014  . Pneumococcal Polysaccharide-23 04/20/2015  . Tdap 03/24/2012  . Zoster 05/02/2012   Health Maintenance  Topic Date Due  . INFLUENZA VACCINE  12/31/2017  . COLONOSCOPY  11/25/2018  . TETANUS/TDAP  03/24/2022  . Hepatitis C Screening  Completed  . PNA vac Low Risk Adult  Completed    Dizziness: very frequent; history of BPPV.  Associated with headaches.  Very frequent. Very positional.  Severe nausea with dizzy.  Extremely strong episode.  Performing Eply maneuvers without success.  Less severe case recently.    Review of Systems  Constitutional: Positive for unexpected weight change. Negative for activity change, appetite change, chills, diaphoresis, fatigue and fever.  HENT: Positive for congestion, rhinorrhea, sinus pressure and sinus pain. Negative for dental problem, drooling, ear discharge, ear pain, facial swelling, hearing loss, mouth sores, nosebleeds, postnasal drip, sneezing, sore throat, tinnitus, trouble swallowing and voice change.   Eyes: Positive for pain and visual  disturbance. Negative for photophobia, discharge, redness and itching.  Respiratory: Negative for apnea, cough, choking, chest tightness, shortness of breath, wheezing and stridor.   Cardiovascular: Negative for chest pain, palpitations and leg swelling.  Gastrointestinal: Negative for abdominal distention, abdominal pain, anal bleeding, blood in stool, constipation, diarrhea, nausea, rectal pain and vomiting.  Endocrine: Negative for cold intolerance, heat intolerance, polydipsia, polyphagia and polyuria.  Genitourinary: Negative for decreased urine volume, difficulty urinating, discharge, dysuria, enuresis, flank pain, frequency, genital sores, hematuria, penile pain, penile swelling, scrotal swelling, testicular pain and urgency.       Nocturia x 3.  Urinary stream weak but stable.  Musculoskeletal: Positive for arthralgias, back pain, myalgias, neck pain and neck stiffness. Negative for gait problem and joint swelling.  Skin: Negative for color change, pallor, rash and wound.  Allergic/Immunologic: Positive for environmental allergies. Negative for food allergies and immunocompromised state.  Neurological: Positive for dizziness, light-headedness and headaches. Negative for tremors, seizures, syncope, facial asymmetry, speech difficulty, weakness and numbness.  Hematological: Negative for adenopathy. Does not bruise/bleed easily.  Psychiatric/Behavioral: Positive for dysphoric mood and sleep disturbance. Negative for agitation, behavioral problems, confusion, decreased concentration, hallucinations, self-injury and suicidal ideas. The patient is nervous/anxious. The patient is not hyperactive.        1100; wakes up 630.    Past Medical History:  Diagnosis Date  . Allergy   . Anxiety   . Arthritis   . BPPV (benign paroxysmal positional vertigo), unspecified laterality   . Colon polyp 11/24/2013   colonoscopy Paul Sparks. Repeat in 5 years.  Marland Kitchen  Dermatofibroma of back 12/11/10   [name].   . Eczema 12/11/10   [name].  . Genital herpes   . IBD (inflammatory bowel disease)    diarrhea, abdominal cramping.  . Inguinal hernia    Bilateral; s/p repair.  . Prostate enlargement    mildly and asymptomatic  . Seborrheic keratosis 12/11/10   [name]/Dermatology consult  . Wears glasses    Past Surgical History:  Procedure Laterality Date  . COLONOSCOPY  11/24/2013   four polyp. Paul Edward, MD.  Repeat in 5 years.  . INGUINAL HERNIA REPAIR  2005   right  . KNEE ARTHROSCOPY    . TONSILLECTOMY    . VASECTOMY     Allergies  Allergen Reactions  . Celebrex [Celecoxib]     REACTION: Hives  . Flexeril [Cyclobenzaprine Hcl]   . Nsaids   . Sulfa Drugs Cross Reactors    Current Outpatient Medications on File Prior to Visit  Medication Sig Dispense Refill  . aspirin 81 MG tablet Take 81 mg by mouth daily.    . cromolyn (NASALCROM) 5.2 MG/ACT nasal spray Place 1 spray into the nose 2 (two) times daily.      Marland Kitchen FLUZONE HIGH-DOSE 0.5 ML SUSY TO BE ADMINISTERED BY PHARMACIST FOR IMMUNIZATION  0  . hydrocortisone-pramoxine (PROCTOFOAM-HC) rectal foam Place 1 applicator rectally 2 (two) times daily. 10 g 3  . loratadine (CLARITIN) 10 MG tablet Take 10 mg by mouth daily.      . minoxidil (ROGAINE) 2 % external solution Apply topically 2 (two) times daily.      . Multiple Vitamins-Minerals (MULTIVITAMIN WITH MINERALS) tablet Take 1 tablet by mouth daily.    Marland Kitchen OVER THE COUNTER MEDICATION ubiquinal 200mg  (co q 10)    . phenazopyridine (PYRIDIUM) 100 MG tablet Take 100 mg by mouth 3 (three) times daily as needed for pain.    . phenylephrine (SUDAFED PE) 10 MG TABS Take 10 mg by mouth every 4 (four) hours as needed.       No current facility-administered medications on file prior to visit.    Social History   Socioeconomic History  . Marital status: Married    Spouse name: Not on file  . Number of children: 2  . Years of education: Not on file  . Highest  education level: Not on file  Occupational History  . 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 Financial resource strain: Not on file  . Food insecurity:    Worry: Not on file    Inability: Not on file  . Transportation needs:    Medical: Not on file    Non-medical: Not on file  Tobacco Use  . Smoking status: Never Smoker  . Smokeless tobacco: Never Used  Substance and Sexual Activity  . Alcohol use: Yes    Alcohol/week: 12.6 oz    Types: 21 Glasses of wine per week    Comment: 3/day   . Drug use: No  . Sexual activity: Yes    Birth control/protection: Post-menopausal, Surgical  Lifestyle  . Physical activity:    Days per week: Not on file    Minutes per session: Not on file  . Stress: Not on file  Relationships  . Social connections:    Talks on phone: Not on file    Gets together: Not on file    Attends religious service: Not on file    Active member of club or organization: Not  on file    Attends meetings of clubs or organizations: Not on file    Relationship status: Not on file  . Intimate partner violence:    Fear of current or ex partner: Not on file    Emotionally abused: Not on file    Physically abused: Not on file    Forced sexual activity: Not on file  Other Topics Concern  . Not on file  Social History Narrative   Marital status:  Married x 8 years; third marriage; happily married.      Children: two children (28, 31); no grandchildren.  Several step grandchildren (9).        Lives: with wife. 1 dog.      Employment:  Retired 06/2015.  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        Tobacco: none      Alcohol: three daily (wine and beer).  Very rare intoxication.      Drugs: none      Exercise:  Walks daily for 20 minutes 1.3 miles per day; walks dog regularly.      Seatbelt: 100% of time      Guns in home: none      Sunscreen: yes; rare sun exposure.      ADLs: independent with ADLs.  Drives      Advanced Directives:  YES; living will; FULL CODE; no prolonged measures.     Family History  Problem Relation Age of Onset  . Dementia Mother        Multi-infarct dementia  . Stroke Mother   . Diabetes Father   . Cancer Paternal Grandfather        lung  . Colon cancer Neg Hx   . Pancreatic cancer Neg Hx   . Rectal cancer Neg Hx   . Stomach cancer Neg Hx        Objective:    BP 102/78   Pulse 76   Temp 98.5 F (36.9 C) (Oral)   Resp 16   Ht 5' 11.65" (1.82 m)   Wt 130 lb (59 kg)   SpO2 99%   BMI 17.80 kg/m  Physical Exam  Constitutional: He is oriented to person, place, and time. He appears well-developed and well-nourished. No distress.  HENT:  Head: Normocephalic and atraumatic.  Right Ear: External ear normal.  Left Ear: External ear normal.  Nose: Nose normal.  Mouth/Throat: Oropharynx is clear and moist.  Eyes: Pupils are equal, round, and reactive to light. Conjunctivae and EOM are normal.  Neck: Normal range of motion. Neck supple. Carotid bruit is not present. No thyromegaly present.  Cardiovascular: Normal rate, regular rhythm, normal heart sounds and intact distal pulses. Exam reveals no gallop and no friction rub.  No murmur heard. Pulmonary/Chest: Effort normal and breath sounds normal. He has no wheezes. He has no rales.  Abdominal: Soft. Bowel sounds are normal. He exhibits no distension and no mass. There is no tenderness. There is no rebound and no guarding. Hernia confirmed negative in the right inguinal area and confirmed negative in the left inguinal area.  Genitourinary: Rectum normal, testes normal and penis normal. Prostate is enlarged. Right testis shows no mass, no swelling and no tenderness. Left testis shows no mass, no swelling and no tenderness.  Musculoskeletal:       Right shoulder: Normal.       Left shoulder: Normal.       Cervical back: Normal.  Lymphadenopathy:  He has no cervical adenopathy. No inguinal adenopathy  noted on the right or left side.  Neurological: He is alert and oriented to person, place, and time. He has normal reflexes. No cranial nerve deficit. He exhibits normal muscle tone. Coordination normal.  Skin: Skin is warm and dry. No rash noted. He is not diaphoretic.  Psychiatric: He has a normal mood and affect. His behavior is normal. Judgment and thought content normal.   No results found. Depression screen [facility] 2/9 10/10/2017 04/29/2016 04/12/2016 02/13/2016 04/20/2015  Decreased Interest 0 0 0 0 0  Down, Depressed, Hopeless 0 0 0 0 0  PHQ - 2 Score 0 0 0 0 0   Fall Risk  10/10/2017 04/29/2016 04/12/2016 02/13/2016 04/20/2015  Falls in the past year? No No No No Yes  Number falls in past yr: - - - - 2 or more  Injury with Fall? - - - - Yes  Comment - - - - sprain left ankle and right shoulder injury        Assessment & Plan:   1. Routine physical examination   2. Seasonal allergic rhinitis due to pollen   3. Gastroesophageal reflux disease without esophagitis   4. Pure hypercholesterolemia   5. Prostate enlargement   6. Degenerative disc disease, cervical   7. Screening for diabetes mellitus   8. Dizziness   9. Irritable bowel syndrome with diarrhea   10. Anxiety and depression   11. Acute non-recurrent maxillary sinusitis     -anticipatory guidance provided --- exercise, weight loss, safe driving practices, aspirin 81mg  daily. -obtain age appropriate screening labs and labs for chronic disease management. -acute sinusitis with dizziness: new; treat with Amoxicillin and Prednisone.   -history fo BPV: with recent dizziness; continue Eply maneuvers. -anxiety and depression: new; patient to consider medication; recommend regular exercise; recommend psychotherapy.     Orders Placed This Encounter  Procedures  . CBC with Differential/Platelet  . Comprehensive metabolic panel    Order Specific Question:   Has the patient fasted?    Answer:   No  . Hemoglobin A1c  . Lipid  panel    Order Specific Question:   Has the patient fasted?    Answer:   No  . PSA  . POCT urinalysis dipstick  . EKG 12-Lead   Meds ordered this encounter  Medications  . amoxicillin (AMOXIL) 500 MG tablet    Sig: Take 2 tablets (1,000 mg total) by mouth 2 (two) times daily.    Dispense:  40 tablet    Refill:  0  . predniSONE (DELTASONE) 20 MG tablet    Sig: Take 3 PO QAM x 1 day, 2 PO QAM x 5 days, 1 PO QAM x 5 days    Dispense:  18 tablet    Refill:  0  . hyoscyamine (LEVSIN, ANASPAZ) 0.125 MG tablet    Sig: Take 1 tablet (0.125 mg total) by mouth every 4 (four) hours as needed.    Dispense:  60 tablet    Refill:  5  . valACYclovir (VALTREX) 500 MG tablet    Sig: Take 1 tablet (500 mg total) by mouth 3 (three) times daily.    Dispense:  30 tablet    Refill:  3  . Zoster Vaccine Adjuvanted [facility]) injection    Sig: Inject 0.5 mLs into the muscle once for 1 dose.    Dispense:  0.5 mL    Refill:  1    Return in about 1 year (around  10/11/2018) for complete physical examiniation Paul SANTIAGO, MD.   Norwood Levo, M.D. Primary Care at [facility] previously [facility] [address] [place], [place]  [address] [phone] phone [phone] fax

## 2017-10-10 NOTE — Patient Instructions (Addendum)
Email Kristi Smith in 3 weeks with an update.   IF you received an x-ray today, you will receive an invoice from [facility]. Please contact [facility] at [phone] with questions or concerns regarding your invoice.   IF you received labwork today, you will receive an invoice from [facility]. Please contact [facility] at [phone] with questions or concerns regarding your invoice.   Our billing staff will not be able to assist you with questions regarding bills from these companies.  You will be contacted with the lab results as soon as they are available. The fastest way to get your results is to activate your My Chart account. Instructions are located on the last page of this paperwork. If you have not heard from us regarding the results in 2 weeks, please contact this office.      Preventive Care 69 Years and Older, Male Preventive care refers to lifestyle choices and visits with your health care provider that can promote health and wellness. What does preventive care include?  A yearly physical exam. This is also called an annual well check.  Dental exams once or twice a year.  Routine eye exams. Ask your health care provider how often you should have your eyes checked.  Personal lifestyle choices, including: ? Daily care of your teeth and gums. ? Regular physical activity. ? Eating a healthy diet. ? Avoiding tobacco and drug use. ? Limiting alcohol use. ? Practicing safe sex. ? Taking low doses of aspirin every day. ? Taking vitamin and mineral supplements as recommended by your health care provider. What happens during an annual well check? The services and screenings done by your health care provider during your annual well check will depend on your age, overall health, lifestyle risk factors, and family history of disease. Counseling Your health care provider may ask you questions about your:  Alcohol use.  Tobacco use.  Drug use.  Emotional  well-being.  Home and relationship well-being.  Sexual activity.  Eating habits.  History of falls.  Memory and ability to understand (cognition).  Work and work environment.  Screening You may have the following tests or measurements:  Height, weight, and BMI.  Blood pressure.  Lipid and cholesterol levels. These may be checked every 5 years, or more frequently if you are over 50 years old.  Skin check.  Lung cancer screening. You may have this screening every year starting at age 55 if you have a 30-pack-year history of smoking and currently smoke or have quit within the past 15 years.  Fecal occult blood test (FOBT) of the stool. You may have this test every year starting at age 50.  Flexible sigmoidoscopy or colonoscopy. You may have a sigmoidoscopy every 5 years or a colonoscopy every 10 years starting at age 50.  Prostate cancer screening. Recommendations will vary depending on your family history and other risks.  Hepatitis C blood test.  Hepatitis B blood test.  Sexually transmitted disease (STD) testing.  Diabetes screening. This is done by checking your blood sugar (glucose) after you have not eaten for a while (fasting). You may have this done every 1-3 years.  Abdominal aortic aneurysm (AAA) screening. You may need this if you are a current or former smoker.  Osteoporosis. You may be screened starting at age 70 if you are at high risk.  Talk with your health care provider about your test results, treatment options, and if necessary, the need for more tests. Vaccines Your health care provider may recommend   certain vaccines, such as:  Influenza vaccine. This is recommended every year.  Tetanus, diphtheria, and acellular pertussis (Tdap, Td) vaccine. You may need a Td booster every 10 years.  Varicella vaccine. You may need this if you have not been vaccinated.  Zoster vaccine. You may need this after age 79.  Measles, mumps, and rubella (MMR)  vaccine. You may need at least one dose of MMR if you were born in 1957 or later. You may also need a second dose.  Pneumococcal 13-valent conjugate (PCV13) vaccine. One dose is recommended after age 43.  Pneumococcal polysaccharide (PPSV23) vaccine. One dose is recommended after age 75.  Meningococcal vaccine. You may need this if you have certain conditions.  Hepatitis A vaccine. You may need this if you have certain conditions or if you travel or work in places where you may be exposed to hepatitis A.  Hepatitis B vaccine. You may need this if you have certain conditions or if you travel or work in places where you may be exposed to hepatitis B.  Haemophilus influenzae type b (Hib) vaccine. You may need this if you have certain risk factors.  Talk to your health care provider about which screenings and vaccines you need and how often you need them. This information is not intended to replace advice given to you by your health care provider. Make sure you discuss any questions you have with your health care provider. Document Released: 06/15/2015 Document Revised: 02/06/2016 Document Reviewed: 03/20/2015 Elsevier Interactive Patient Education  [employer].

## 2017-10-11 LAB — COMPREHENSIVE METABOLIC PANEL
ALT: 17 IU/L (ref 0–44)
AST: 23 IU/L (ref 0–40)
Albumin/Globulin Ratio: 2.4 — ABNORMAL HIGH (ref 1.2–2.2)
Albumin: 4.8 g/dL (ref 3.6–4.8)
Alkaline Phosphatase: 89 IU/L (ref 39–117)
BILIRUBIN TOTAL: 0.4 mg/dL (ref 0.0–1.2)
BUN/Creatinine Ratio: 16 (ref 10–24)
BUN: 13 mg/dL (ref 8–27)
CALCIUM: 9.6 mg/dL (ref 8.6–10.2)
CHLORIDE: 100 mmol/L (ref 96–106)
CO2: 26 mmol/L (ref 20–29)
Creatinine, Ser: 0.83 mg/dL (ref 0.76–1.27)
GFR, EST AFRICAN AMERICAN: 105 mL/min/{1.73_m2} (ref >59)
GFR, EST NON AFRICAN AMERICAN: 90 mL/min/{1.73_m2} (ref >59)
GLOBULIN, TOTAL: 2 g/dL (ref 1.5–4.5)
Glucose: 96 mg/dL (ref 65–99)
Potassium: 4.6 mmol/L (ref 3.5–5.2)
SODIUM: 141 mmol/L (ref 134–144)
TOTAL PROTEIN: 6.8 g/dL (ref 6.0–8.5)

## 2017-10-11 LAB — CBC WITH DIFFERENTIAL/PLATELET
BASOS ABS: 0 10*3/uL (ref 0.0–0.2)
Basos: 0 %
EOS (ABSOLUTE): 0.1 10*3/uL (ref 0.0–0.4)
Eos: 3 %
HEMOGLOBIN: 14.5 g/dL (ref 13.0–17.7)
Hematocrit: 43 % (ref 37.5–51.0)
IMMATURE GRANS (ABS): 0 10*3/uL (ref 0.0–0.1)
Immature Granulocytes: 0 %
LYMPHS ABS: 1 10*3/uL (ref 0.7–3.1)
Lymphs: 33 %
MCH: 32.2 pg (ref 26.6–33.0)
MCHC: 33.7 g/dL (ref 31.5–35.7)
MCV: 96 fL (ref 79–97)
MONOCYTES: 6 %
Monocytes Absolute: 0.2 10*3/uL (ref 0.1–0.9)
Neutrophils Absolute: 1.8 10*3/uL (ref 1.4–7.0)
Neutrophils: 58 %
PLATELETS: 150 10*3/uL (ref 150–379)
RBC: 4.5 x10E6/uL (ref 4.14–5.80)
RDW: 13.4 % (ref 12.3–15.4)
WBC: 3.1 10*3/uL — AB (ref 3.4–10.8)

## 2017-10-11 LAB — HEMOGLOBIN A1C
Est. average glucose Bld gHb Est-mCnc: 103 mg/dL
Hgb A1c MFr Bld: 5.2 % (ref 4.8–5.6)

## 2017-10-11 LAB — LIPID PANEL
CHOL/HDL RATIO: 3.9 ratio (ref 0.0–5.0)
Cholesterol, Total: 222 mg/dL — ABNORMAL HIGH (ref 100–199)
HDL: 57 mg/dL (ref >39)
LDL CALC: 142 mg/dL — AB (ref 0–99)
Triglycerides: 113 mg/dL (ref 0–149)
VLDL CHOLESTEROL CAL: 23 mg/dL (ref 5–40)

## 2017-10-11 LAB — PSA: PROSTATE SPECIFIC AG, SERUM: 2.8 ng/mL (ref 0.0–4.0)

## 2017-10-27 ENCOUNTER — Encounter: Payer: Self-pay | Admitting: Family Medicine

## 2017-11-02 ENCOUNTER — Encounter: Payer: Self-pay | Admitting: Family Medicine

## 2017-11-08 MED ORDER — CITALOPRAM HYDROBROMIDE 20 MG PO TABS
10.0000 mg | ORAL_TABLET | Freq: Every day | ORAL | 0 refills | Status: DC
Start: 2017-11-08 — End: 2018-02-09

## 2017-11-11 DIAGNOSIS — H5203 Hypermetropia, bilateral: Secondary | ICD-10-CM | POA: Diagnosis not present

## 2017-11-11 DIAGNOSIS — H52223 Regular astigmatism, bilateral: Secondary | ICD-10-CM | POA: Diagnosis not present

## 2017-11-11 DIAGNOSIS — H524 Presbyopia: Secondary | ICD-10-CM | POA: Diagnosis not present

## 2017-12-15 DIAGNOSIS — H50612 Brown's sheath syndrome, left eye: Secondary | ICD-10-CM | POA: Diagnosis not present

## 2017-12-15 DIAGNOSIS — H5112 Convergence excess: Secondary | ICD-10-CM | POA: Diagnosis not present

## 2017-12-15 DIAGNOSIS — H11442 Conjunctival cysts, left eye: Secondary | ICD-10-CM | POA: Diagnosis not present

## 2017-12-20 DIAGNOSIS — F329 Major depressive disorder, single episode, unspecified: Secondary | ICD-10-CM | POA: Insufficient documentation

## 2017-12-20 DIAGNOSIS — F32A Depression, unspecified: Secondary | ICD-10-CM | POA: Insufficient documentation

## 2017-12-20 DIAGNOSIS — F419 Anxiety disorder, unspecified: Secondary | ICD-10-CM

## 2018-01-03 ENCOUNTER — Encounter: Payer: Self-pay | Admitting: Family Medicine

## 2018-01-03 ENCOUNTER — Other Ambulatory Visit: Payer: Self-pay | Admitting: Family Medicine

## 2018-01-03 MED ORDER — ATORVASTATIN CALCIUM 10 MG PO TABS: 10.0000 mg | ORAL_TABLET | Freq: Every day | ORAL | 3 refills | Status: DC

## 2018-02-02 ENCOUNTER — Telehealth: Payer: Self-pay | Admitting: Family Medicine

## 2018-02-02 NOTE — Telephone Encounter (Signed)
Copied from [place] [id number]. Topic: General - Other >> Feb 02, 2018  3:15 PM Oneta Rack wrote: Relation to pt: self  Call back number: [phone]   Reason for call:  Patient received a call from current pharmacy [facility] advising since insurance has been changed, all future Rx need to go to [facility] [address] [place], [place] [address]  [phone]. As per patient request, pharmacy information has been updated. Patient states he will be establishing with [facility].

## 2018-02-05 MED FILL — ATORVASTATIN 10 MG TABLET: 10 | 90 days supply | Qty: 90 | Fill #0

## 2018-02-09 ENCOUNTER — Telehealth: Payer: Self-pay | Admitting: Family Medicine

## 2018-02-09 ENCOUNTER — Other Ambulatory Visit: Payer: Self-pay | Admitting: *Deleted

## 2018-02-09 DIAGNOSIS — M9901 Segmental and somatic dysfunction of cervical region: Secondary | ICD-10-CM | POA: Diagnosis not present

## 2018-02-09 DIAGNOSIS — M530 Cervicocranial syndrome: Secondary | ICD-10-CM | POA: Diagnosis not present

## 2018-02-09 MED ORDER — CITALOPRAM HYDROBROMIDE 20 MG PO TABS: 10.0000 mg | ORAL_TABLET | Freq: Every day | ORAL | 0 refills | Status: DC

## 2018-02-09 MED FILL — CITALOPRAM HBR 20 MG TABLET: 20 | 90 days supply | Qty: 90 | Fill #0

## 2018-02-09 NOTE — Telephone Encounter (Signed)
Medication refilled and sent to requested pharmacy

## 2018-02-09 NOTE — Telephone Encounter (Signed)
Copied from [place] [id number]. Topic: Quick Communication - Rx Refill/Question >> Feb 09, 2018 10:38 AM Neva Seat wrote: citalopram (CELEXA) 20 MG tablet  Pt needing refills  [facility] - Phone: [phone]

## 2018-02-11 DIAGNOSIS — M9901 Segmental and somatic dysfunction of cervical region: Secondary | ICD-10-CM | POA: Diagnosis not present

## 2018-02-11 DIAGNOSIS — M530 Cervicocranial syndrome: Secondary | ICD-10-CM | POA: Diagnosis not present

## 2018-02-16 DIAGNOSIS — E0500 Thyrotoxicosis with diffuse goiter without thyrotoxic crisis or storm: Secondary | ICD-10-CM | POA: Diagnosis not present

## 2018-02-16 DIAGNOSIS — H5021 Vertical strabismus, right eye: Secondary | ICD-10-CM | POA: Diagnosis not present

## 2018-02-19 ENCOUNTER — Telehealth: Payer: Self-pay | Admitting: Family Medicine

## 2018-02-19 NOTE — Telephone Encounter (Signed)
Copied from [place] [id number]. Topic: Inquiry >> Feb 19, 2018  1:05 PM Conception Chancy, NT wrote: Reason for CRM: patient is calling and states he would like to speak with Dr. Pamella Pert nurse in regards to getting a thyroid test done.

## 2018-02-24 ENCOUNTER — Other Ambulatory Visit: Payer: Self-pay | Admitting: *Deleted

## 2018-02-24 DIAGNOSIS — F329 Major depressive disorder, single episode, unspecified: Secondary | ICD-10-CM

## 2018-02-24 DIAGNOSIS — E7800 Pure hypercholesterolemia, unspecified: Secondary | ICD-10-CM

## 2018-02-24 DIAGNOSIS — F419 Anxiety disorder, unspecified: Secondary | ICD-10-CM

## 2018-02-24 NOTE — Telephone Encounter (Signed)
Left detailed message to come in for a lab only draw for his tsh

## 2018-02-25 ENCOUNTER — Ambulatory Visit (INDEPENDENT_AMBULATORY_CARE_PROVIDER_SITE_OTHER): Payer: 59 | Admitting: Family Medicine

## 2018-02-25 DIAGNOSIS — F329 Major depressive disorder, single episode, unspecified: Secondary | ICD-10-CM | POA: Diagnosis not present

## 2018-02-25 DIAGNOSIS — E7800 Pure hypercholesterolemia, unspecified: Secondary | ICD-10-CM

## 2018-02-25 DIAGNOSIS — Z23 Encounter for immunization: Secondary | ICD-10-CM

## 2018-02-25 DIAGNOSIS — F419 Anxiety disorder, unspecified: Secondary | ICD-10-CM

## 2018-02-25 NOTE — Progress Notes (Signed)
Nurse visit

## 2018-02-26 LAB — TSH: TSH: 0.823 u[IU]/mL (ref 0.450–4.500)

## 2018-03-12 ENCOUNTER — Encounter: Payer: Self-pay | Admitting: Family Medicine

## 2018-03-12 ENCOUNTER — Ambulatory Visit (INDEPENDENT_AMBULATORY_CARE_PROVIDER_SITE_OTHER): Payer: 59 | Admitting: Family Medicine

## 2018-03-12 ENCOUNTER — Other Ambulatory Visit: Payer: Self-pay

## 2018-03-12 VITALS — BP 109/77 | HR 79 | Temp 98.7°F | Resp 16 | Ht 70.5 in | Wt 129.8 lb

## 2018-03-12 DIAGNOSIS — E7800 Pure hypercholesterolemia, unspecified: Secondary | ICD-10-CM | POA: Diagnosis not present

## 2018-03-12 DIAGNOSIS — H0520 Unspecified exophthalmos: Secondary | ICD-10-CM | POA: Diagnosis not present

## 2018-03-12 DIAGNOSIS — F329 Major depressive disorder, single episode, unspecified: Secondary | ICD-10-CM | POA: Diagnosis not present

## 2018-03-12 DIAGNOSIS — F32A Depression, unspecified: Secondary | ICD-10-CM

## 2018-03-12 DIAGNOSIS — F419 Anxiety disorder, unspecified: Secondary | ICD-10-CM | POA: Diagnosis not present

## 2018-03-12 NOTE — Patient Instructions (Signed)
° ° ° °  If you have lab work done today you will be contacted with your lab results within the next 2 weeks.  If you have not heard from us then please contact us. The fastest way to get your results is to register for My Chart. ° ° °IF you received an x-ray today, you will receive an invoice from [facility]. Please contact [facility] at [phone] with questions or concerns regarding your invoice.  ° °IF you received labwork today, you will receive an invoice from [facility]. Please contact [facility] at [phone] with questions or concerns regarding your invoice.  ° °Our billing staff will not be able to assist you with questions regarding bills from these companies. ° °You will be contacted with the lab results as soon as they are available. The fastest way to get your results is to activate your My Chart account. Instructions are located on the last page of this paperwork. If you have not heard from us regarding the results in 2 weeks, please contact this office. °  ° ° ° °

## 2018-03-12 NOTE — Progress Notes (Signed)
10/11/20192:40 PM  Paul Sparks 08/08/1950, 67 y.o. male [id number]  Chief Complaint  Patient presents with  . discuss current care    "diagnosed with thyroid condition that is affecting my eyes".  . Medication    per pt "I would like to discuss all current medications".    HPI:   Patient is a 67 y.o. male with past medical history significant for HLP, allergic rhinitis, who presents today for with several concerns  Previous PCP Dr Tamala Julian Last visit 09/2017  Had routine eye exam earlier this year and noticed that his left eye was inflamed, prescribed steroid eye drops, which really did not help He was also told that his left eye was bulging Eye doctor referred him to Dr Annamaria Boots, specialist in "eye muscle" and was told he had concerns for graves disease Referred to Dr Forde Dandy, endocrinologist Will see him on Nov 26th for the first time Having double vision  Denies any neck swelling, dysphagia, SOB Denies any changes to hair, skin, nails, heat or cold intolerance, palpiations Does have mild tremors No significant changes in weight Feels anxiety better since he started citalopram, but does not like being on Most of his anxiety comes from current living situations  Wondering if he really needs to be on atorvastatin LDL 148 10 year ASCVD risk 13%, with treatment statin and/or ASA 12.1%  Fall Risk  03/12/2018 03/12/2018 10/10/2017 04/29/2016 04/12/2016  Falls in the past year? No No No No No  Number falls in past yr: - - - - -  Injury with Fall? - - - - -  Comment - - - - -     Depression screen [facility] 2/9 03/12/2018 03/12/2018 10/10/2017  Decreased Interest 0 0 0  Down, Depressed, Hopeless 0 0 0  PHQ - 2 Score 0 0 0    Allergies  Allergen Reactions  . Celebrex [Celecoxib]     REACTION: Hives  . Flexeril [Cyclobenzaprine Hcl]   . Nsaids   . Sulfa Drugs Cross Reactors     Prior to Admission medications   Medication Sig Start Date End Date Taking? Authorizing Provider    aspirin 81 MG tablet Take 81 mg by mouth daily.   Yes [provider]  atorvastatin (LIPITOR) 10 MG tablet Take 1 tablet (10 mg total) by mouth daily. 01/03/18  Yes Wardell Honour, MD  citalopram (CELEXA) 20 MG tablet Take 0.5 tablets (10 mg total) by mouth daily. For one month; then increase to one tablet daily 02/09/18  Yes Rutherford Guys, MD  cromolyn (NASALCROM) 5.2 MG/ACT nasal spray Place 1 spray into the nose 2 (two) times daily.     Yes [provider]  FLUZONE HIGH-DOSE 0.5 ML SUSY TO BE ADMINISTERED BY PHARMACIST FOR IMMUNIZATION 02/26/15  Yes [provider]  minoxidil (ROGAINE) 2 % external solution Apply topically 2 (two) times daily.     Yes [provider]  Multiple Vitamins-Minerals (MULTIVITAMIN WITH MINERALS) tablet Take 1 tablet by mouth daily.   Yes [provider]  phenylephrine (SUDAFED PE) 10 MG TABS Take 10 mg by mouth every 4 (four) hours as needed.     Yes [provider]  hydrocortisone-pramoxine (PROCTOFOAM-HC) rectal foam Place 1 applicator rectally 2 (two) times daily. Patient not taking: Reported on 03/12/2018 05/03/15   Wardell Honour, MD  hyoscyamine (LEVSIN, ANASPAZ) 0.125 MG tablet Take 1 tablet (0.125 mg total) by mouth every 4 (four) hours as needed. Patient not taking: Reported on 03/12/2018  10/10/17   Wardell Honour, MD  OVER THE COUNTER MEDICATION ubiquinal 200mg  (co q 10)    [provider]  valACYclovir (VALTREX) 500 MG tablet Take 1 tablet (500 mg total) by mouth 3 (three) times daily. Patient not taking: Reported on 03/12/2018 10/10/17   Wardell Honour, MD    Past Medical History:  Diagnosis Date  . Allergy   . Anxiety   . Arthritis   . BPPV (benign paroxysmal positional vertigo), unspecified laterality   . Colon polyp 11/24/2013   colonoscopy [name]. Repeat in 5 years.  . Dermatofibroma of back 12/11/10   [name].  . Eczema 12/11/10   [name].  . Genital  herpes   . IBD (inflammatory bowel disease)    diarrhea, abdominal cramping.  . Inguinal hernia    Bilateral; s/p repair.  . Prostate enlargement    mildly and asymptomatic  . Seborrheic keratosis 12/11/10   [name]/Dermatology consult  . Wears glasses     Past Surgical History:  Procedure Laterality Date  . COLONOSCOPY  11/24/2013   four polyp. [name], MD.  Repeat in 5 years.  . INGUINAL HERNIA REPAIR  2005   right  . KNEE ARTHROSCOPY    . TONSILLECTOMY    . VASECTOMY      Social History   Tobacco Use  . Smoking status: Never Smoker  . Smokeless tobacco: Never Used  Substance Use Topics  . Alcohol use: Yes    Alcohol/week: 21.0 standard drinks    Types: 21 Glasses of wine per week    Comment: 3/day     Family History  Problem Relation Age of Onset  . Dementia Mother        Multi-infarct dementia  . Stroke Mother   . Diabetes Father   . Cancer Paternal Grandfather        lung  . Colon cancer Neg Hx   . Pancreatic cancer Neg Hx   . Rectal cancer Neg Hx   . Stomach cancer Neg Hx     Review of Systems  Constitutional: Negative for chills and fever.  Eyes: Positive for double vision.  Respiratory: Negative for cough and shortness of breath.   Cardiovascular: Negative for chest pain, palpitations and leg swelling.  Gastrointestinal: Negative for abdominal pain, nausea and vomiting.  Neurological: Positive for tremors.  Psychiatric/Behavioral: The patient is nervous/anxious.    Per hpi  OBJECTIVE:  Blood pressure 109/77, pulse 79, temperature 98.7 F (37.1 C), temperature source Oral, resp. rate 16, height 5' 10.5" (1.791 m), weight 129 lb 12.8 oz (58.9 kg), SpO2 96 %. Body mass index is 18.36 kg/m.   Wt Readings from Last 3 Encounters:  03/12/18 129 lb 12.8 oz (58.9 kg)  10/10/17 130 lb (59 kg)  04/29/16 134 lb (60.8 kg)    Physical Exam  Constitutional: He is oriented to person, place, and time. He appears well-developed and well-nourished.    HENT:  Head: Normocephalic and atraumatic.  Mouth/Throat: Oropharynx is clear and moist.  Eyes: Pupils are equal, round, and reactive to light. Conjunctivae and EOM are normal.  Left eye protruding forward  Neck: Neck supple. No thyromegaly present.  Cardiovascular: Normal rate and regular rhythm. Exam reveals no gallop and no friction rub.  No murmur heard. Pulmonary/Chest: Effort normal and breath sounds normal. He has no wheezes. He has no rales.  Musculoskeletal: He exhibits no edema.  Neurological: He is alert and oriented to person, place, and time.  Skin: Skin is  warm and dry.  Psychiatric: He has a normal mood and affect.  Nursing note and vitals reviewed.   ASSESSMENT and PLAN  1. Pure hypercholesterolemia Discussed 10 years risk of 13% without statin vs 12.1% without statin. Patient would like to stop med for now, continue with LFM. Continue to monitor yearly  2. Anxiety and depression Controlled. Continue current regime.   3. Exophthalmos of left eye Has been evaluated by ohtho, has upcoming appt with endo. Checking labs today. - TSH - T4, Free - Thyroid Stimulating Immunoglobulin - Thyrotropin receptor autoabs  Return for may 2019, for AWV.    Rutherford Guys, MD [facility] at [facility], [place] [address] Ph.  [phone] Fax [phone]

## 2018-03-13 LAB — TSH: TSH: 0.499 u[IU]/mL (ref 0.450–4.500)

## 2018-03-13 LAB — T4, FREE: Free T4: 1.09 ng/dL (ref 0.82–1.77)

## 2018-03-13 LAB — THYROID STIMULATING IMMUNOGLOBULIN: Thyroid Stim Immunoglobulin: 2.19 IU/L — ABNORMAL HIGH (ref 0.00–0.55)

## 2018-03-13 LAB — THYROTROPIN RECEPTOR AUTOABS: Thyrotropin Receptor Ab: 1.87 IU/L — ABNORMAL HIGH (ref 0.00–1.75)

## 2018-04-27 DIAGNOSIS — F418 Other specified anxiety disorders: Secondary | ICD-10-CM | POA: Diagnosis not present

## 2018-04-27 DIAGNOSIS — Z681 Body mass index (BMI) 19 or less, adult: Secondary | ICD-10-CM | POA: Diagnosis not present

## 2018-04-27 DIAGNOSIS — E7849 Other hyperlipidemia: Secondary | ICD-10-CM | POA: Diagnosis not present

## 2018-04-27 DIAGNOSIS — K589 Irritable bowel syndrome without diarrhea: Secondary | ICD-10-CM | POA: Diagnosis not present

## 2018-04-27 DIAGNOSIS — H0520 Unspecified exophthalmos: Secondary | ICD-10-CM | POA: Diagnosis not present

## 2018-04-27 DIAGNOSIS — J45909 Unspecified asthma, uncomplicated: Secondary | ICD-10-CM | POA: Diagnosis not present

## 2018-04-27 DIAGNOSIS — E0500 Thyrotoxicosis with diffuse goiter without thyrotoxic crisis or storm: Secondary | ICD-10-CM | POA: Diagnosis not present

## 2018-05-24 ENCOUNTER — Other Ambulatory Visit: Payer: Self-pay | Admitting: Family Medicine

## 2018-05-24 MED FILL — CITALOPRAM HBR 20 MG TABLET: 20 | 90 days supply | Qty: 90 | Fill #0

## 2018-08-02 ENCOUNTER — Encounter: Payer: Self-pay | Admitting: Family Medicine

## 2018-08-02 DIAGNOSIS — E0500 Thyrotoxicosis with diffuse goiter without thyrotoxic crisis or storm: Secondary | ICD-10-CM | POA: Diagnosis not present

## 2018-08-02 DIAGNOSIS — H0520 Unspecified exophthalmos: Secondary | ICD-10-CM | POA: Diagnosis not present

## 2018-08-02 MED ORDER — CITALOPRAM HYDROBROMIDE 20 MG PO TABS: 20.0000 mg | ORAL_TABLET | Freq: Every day | ORAL | 1 refills | Status: DC

## 2018-08-06 IMAGING — US US ART/VEN ABD/PELV/SCROTUM DOPPLER LTD
1 series · 13 of 25 positions shown · non-contrast
Comparison: None

CLINICAL DATA: Palpable lump inferior in the right testicle, no
current pain

EXAM:
ULTRASOUND OF SCROTUM
TECHNIQUE: Complete ultrasound examination of the testicles, epididymis, and
other scrotal structures was performed.

[Series 1: us art/ven abd/pelv/scrotum doppler ltd · 0.08mm/px · 65 acquisitions, 13 frames shown]
[im 1/65]
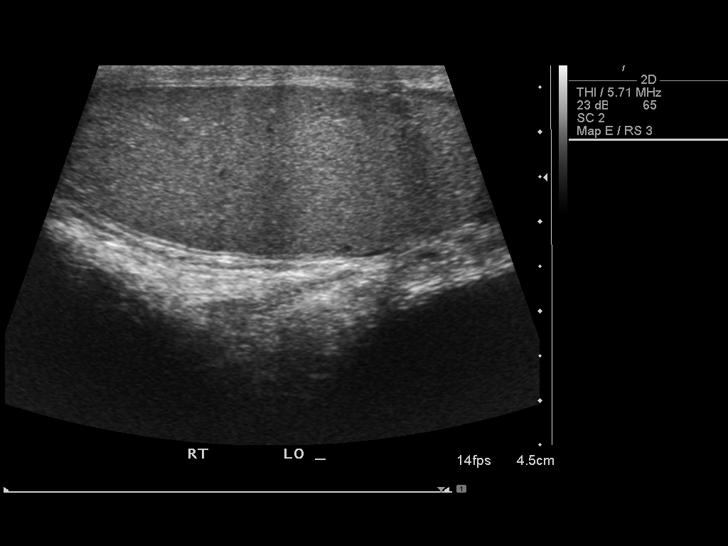
[im 6/65]
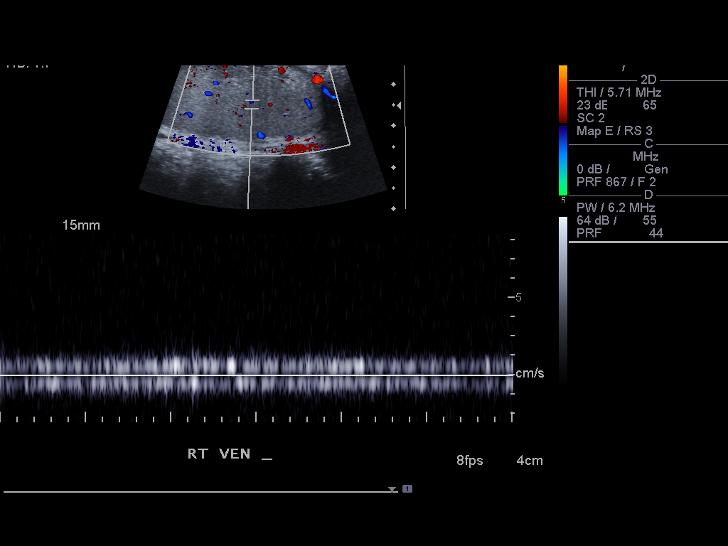
[im 11/65]
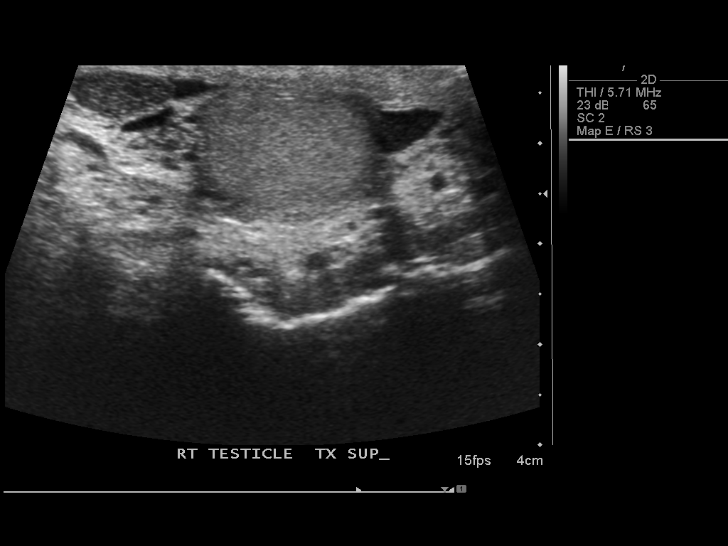
[im 17/65]
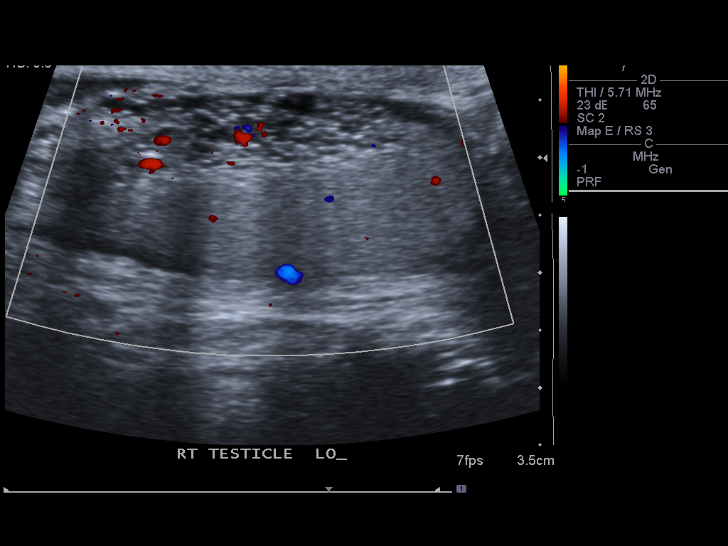
[im 22/65]
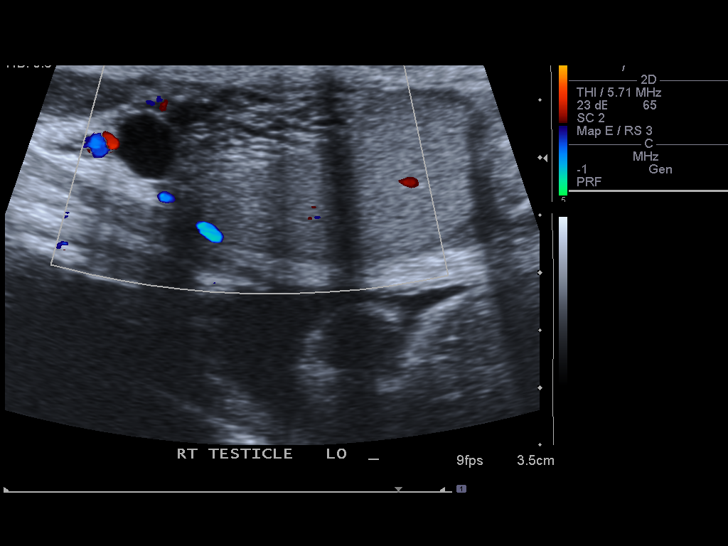
[im 27/65]
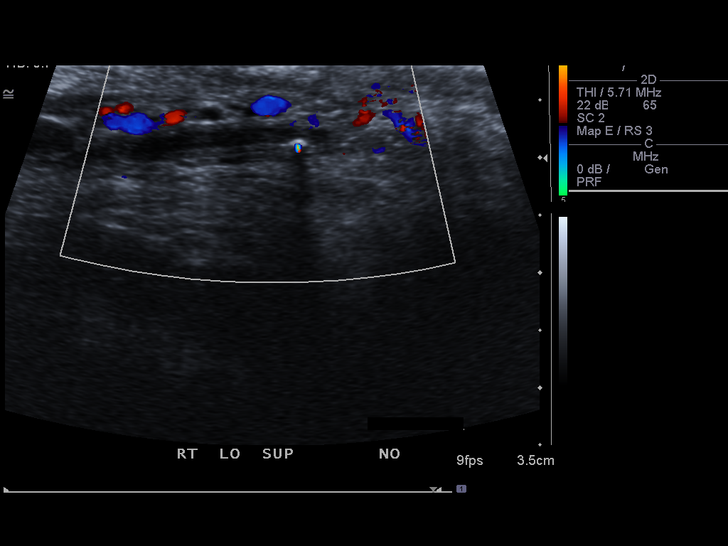
[im 33/65]
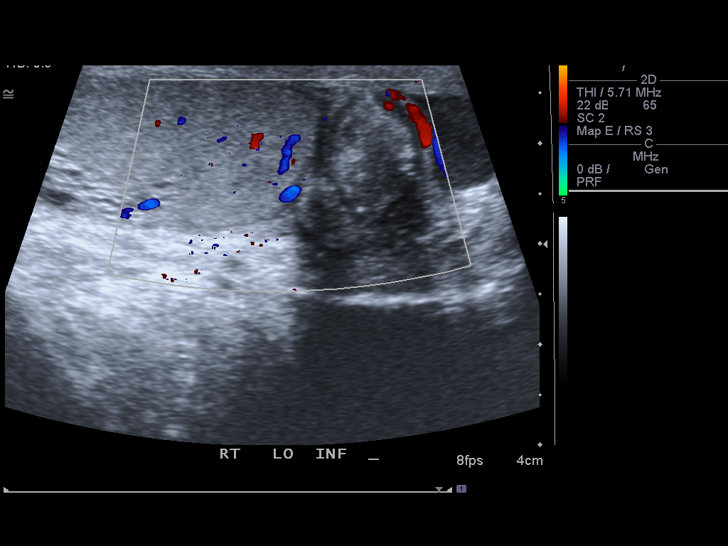
[im 38/65]
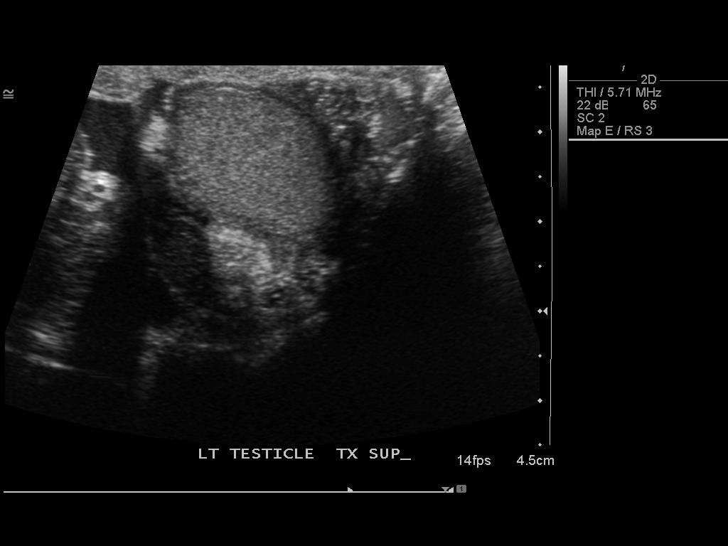
[im 43/65]
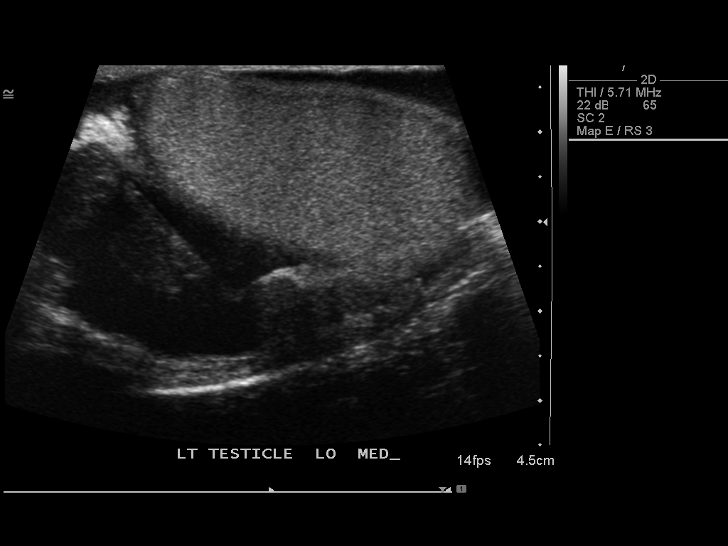
[im 49/65]
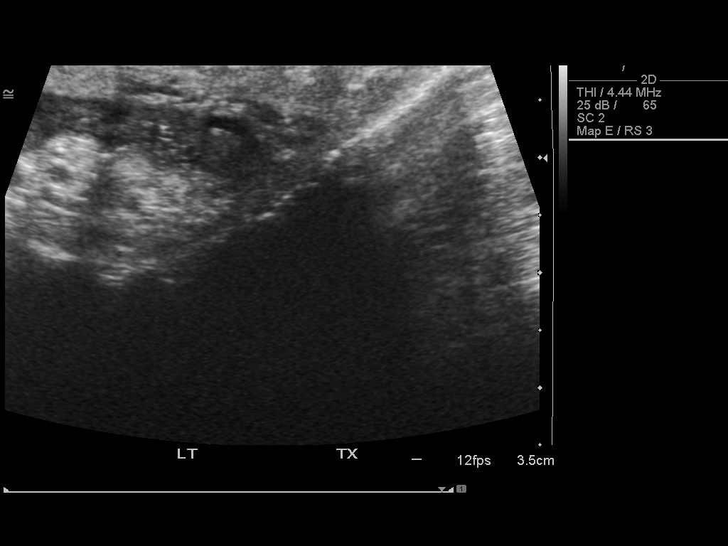
[im 54/65]
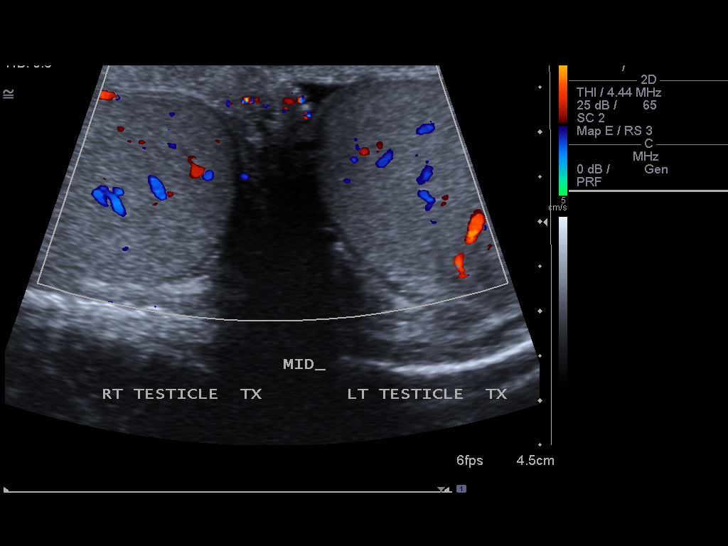
[im 59/65]
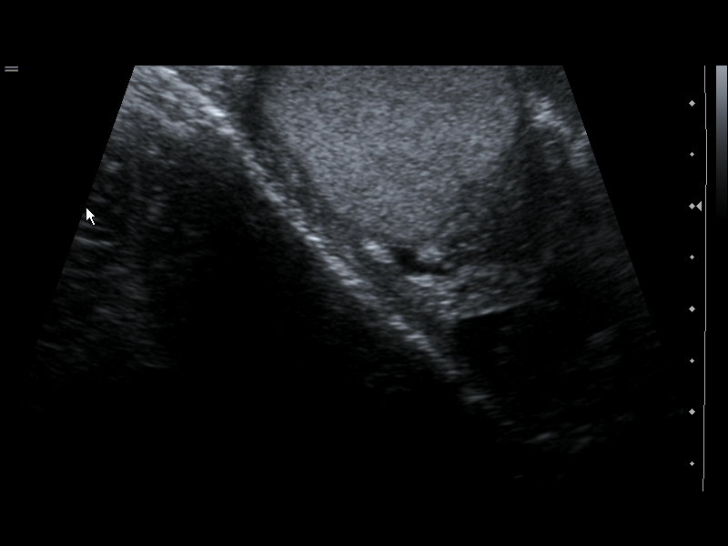
[im 65/65]
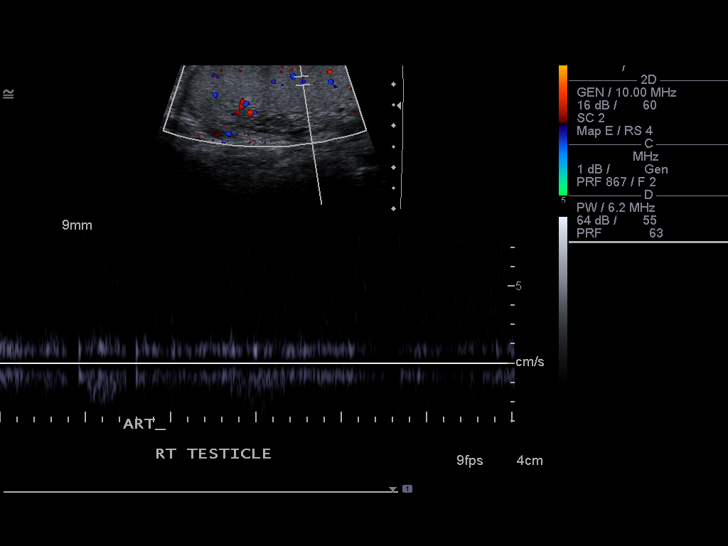

[13 of 25 positions shown; findings below may reference images not displayed]

FINDINGS: Right testicle

Measurements: The right testicle measures 4.9 x 2.0 x 3.4 cm.. No
intratesticular abnormality is seen. Blood flow is demonstrated to
the right testicle with our Oxendine and venous waveforms although the
arterial waveform is somewhat limited.

Left testicle

Measurements: The left testicle measures 4.8 x 2.6 x 2.9 cm.. No
intratesticular abnormality is noted. Blood flow is demonstrated to
the left testicle with arterial and venous waveforms.

Right epididymis: Right epididymis is somewhat inhomogeneous and a 7
mm epididymal cyst is present.

Left epididymis: The left epididymis is prominent which could
indicate chronic epididymitis. No significant increased blood flow
is currently seen.

Hydrocele:  Small amount of fluid is noted bilaterally.

Varicocele:  No hydrocele is seen.

The area palpation represents is somewhat inhomogeneous
solid-appearing structure in the inferior scrotum which is separate
from the testicle and may represent a sperm granuloma.
IMPRESSION: 1. The area in question on palpates may represent a sperm granuloma
being extratesticular and consistent with a benign process.
2. Blood flow is demonstrated to both testicles.
3. Small amount of fluid bilaterally.
4. Somewhat prominent left epididymis may represent chronic
epididymitis.

## 2018-08-11 DIAGNOSIS — H5112 Convergence excess: Secondary | ICD-10-CM | POA: Diagnosis not present

## 2018-09-08 ENCOUNTER — Other Ambulatory Visit: Payer: Self-pay

## 2018-09-08 ENCOUNTER — Ambulatory Visit (INDEPENDENT_AMBULATORY_CARE_PROVIDER_SITE_OTHER): Payer: Medicare Other | Admitting: Registered Nurse

## 2018-09-08 ENCOUNTER — Encounter: Payer: Self-pay | Admitting: Registered Nurse

## 2018-09-08 VITALS — BP 109/77 | Ht 71.0 in | Wt 129.0 lb

## 2018-09-08 DIAGNOSIS — Z0000 Encounter for general adult medical examination without abnormal findings: Secondary | ICD-10-CM | POA: Diagnosis not present

## 2018-09-08 NOTE — Progress Notes (Signed)
Presents today for [employer] Visit This visit took place via telephone. The patient understands that this serves to take place of an in-person visit.  Date of last exam: 03/12/18 - hypercholesterolemia visit with Dr. Pamella Pert    10/10/17 - Routine Physical exam with Dr. Tamala Julian  Interpreter used for this visit? No  Patient Care Team: Rutherford Guys, MD as PCP - General (Family Medicine)   Other items to address today: Brief discussion of Thyroid Eye Disease care   Other Screening: Last screening for diabetes: HgA1C on 10/10/17 : 5.2% Last lipid screening: Lipid Panel on 10/10/17 : LDL (^), Cholesterol (^)  ADVANCE DIRECTIVES: Discussed: Yes On File: No: patient will bring to next visit Materials Provided: No, pt declined  Immunization status:  Immunization History  Administered Date(s) Administered  . Influenza Split 11/15/2011, 01/31/2013  . Influenza, High Dose Seasonal PF 02/25/2018  . Influenza,inj,Quad PF,6+ Mos 02/13/2016  . Influenza-Unspecified 02/24/2014, 02/24/2017  . Pneumococcal Conjugate-13 04/17/2014  . Pneumococcal Polysaccharide-23 04/20/2015  . Tdap 03/24/2012  . Zoster 05/02/2012     There are no preventive care reminders to display for this patient.   Functional Status Survey: Is the patient deaf or have difficulty hearing?: No Does the patient have difficulty seeing, even when wearing glasses/contacts?: No Does the patient have difficulty concentrating, remembering, or making decisions?: No Does the patient have difficulty walking or climbing stairs?: No Does the patient have difficulty dressing or bathing?: No Does the patient have difficulty doing errands alone such as visiting a doctor's office or shopping?: No Clinical Intake - 09/08/18 0840      Pain   Pain   0-10    Pain Score  3    ranges from 0 up to 5-7 range with flair of knee pain   Pain Type  Chronic pain    Pain Location  Knee    Pain Orientation  Right    Pain Onset  More than a month ago    Pain Frequency  Occasional    Effect of Pain on Daily Activities  minor to none      Nutrition Screen   Nutritional Status  BMI of 19-24  Normal    Nutritional Risks  None    Diabetes  No    CBG done?  No    Did pt. bring in CBG monitor from home?  No      Functional Status   Activities of Daily Living  Independent    Ambulation  Independent    Medication Administration  Independent    Home Management  Independent      Risk/Barriers   Barriers to Care Management & Learning  None      Abuse/Neglect   Do you feel unsafe in your current relationship?  No    Do you feel physically threatened by others?  No    Anyone hurting you at home, work, or school?  No    Unable to ask?  No    Information provided on Community resources  Other (comment)   Pt declined     Patient Literacy   How often do you need to have someone help you when you read instructions, pamphlets, or other written materials from your doctor or pharmacy?  1 - Never      Investment banker, operational Needed?  No       6CIT Screen 09/08/2018  What Year? 0 points  What month? 0 points  What time?  0 points  Count back from 20 0 points  Months in reverse 0 points  Repeat phrase 0 points  Total Score 0         Home Environment: Patient lives at home with wife. Denies any concerns about safety at home. Pt states that they live in a townhome community with ability to take local walks and socialize with neighbors (prior to COVID-19).   Patient Active Problem List   Diagnosis Date Noted  . Anxiety and depression 12/20/2017  . Leukocytopenia 04/20/2015  . Genital herpes 04/17/2014  . Leukocytopenia, unspecified 08/02/2012  . Degenerative disc disease, lumbar 08/02/2012  . Degenerative disc disease, cervical 08/02/2012  . Folate deficiency 04/27/2012  . Pure hypercholesterolemia 04/27/2012  . Irritable bowel syndrome with diarrhea 03/24/2012  . Skin lesion 03/24/2012   . Allergic rhinitis 03/24/2012  . Inguinal hernia 12/10/2010  . Prostate enlargement   . GERD 09/21/2008     Past Medical History:  Diagnosis Date  . Allergy   . Anxiety   . Arthritis   . BPPV (benign paroxysmal positional vertigo), unspecified laterality   . Colon polyp 11/24/2013   colonoscopy [name]. Repeat in 5 years.  . Dermatofibroma of back 12/11/10   [name].  . Eczema 12/11/10   [name].  . Genital herpes   . IBD (inflammatory bowel disease)    diarrhea, abdominal cramping.  . Inguinal hernia    Bilateral; s/p repair.  . Prostate enlargement    mildly and asymptomatic  . Seborrheic keratosis 12/11/10   [name]/Dermatology consult  . Wears glasses      Past Surgical History:  Procedure Laterality Date  . COLONOSCOPY  11/24/2013   four polyp. [name], MD.  Repeat in 5 years.  . INGUINAL HERNIA REPAIR  2005   right  . KNEE ARTHROSCOPY    . TONSILLECTOMY    . VASECTOMY       Family History  Problem Relation Age of Onset  . Dementia Mother        Multi-infarct dementia  . Stroke Mother   . Diabetes Father   . Cancer Paternal Grandfather        lung  . Colon cancer Neg Hx   . Pancreatic cancer Neg Hx   . Rectal cancer Neg Hx   . Stomach cancer Neg Hx      Social History   Socioeconomic History  . Marital status: Married    Spouse name: Not on file  . Number of children: 2  . Years of education: Not on file  . Highest education level: Not on file  Occupational History  . 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 Financial resource strain: Not hard at all  . Food insecurity:    Worry: Never true    Inability: Never true  . Transportation needs:    Medical: No    Non-medical: No  Tobacco Use  . Smoking status: Never Smoker  . Smokeless tobacco: Never Used  Substance and Sexual Activity  . Alcohol use: Yes    Alcohol/week: 2.0 standard drinks    Types: 2 Glasses of wine per  week    Comment: 2 glasses of wine with meals 4x per week  . Drug use: No  . Sexual activity: Yes    Birth control/protection: Post-menopausal, Surgical  Lifestyle  . Physical activity:    Days per week: 7 days    Minutes per session: 60 min  .  Stress: Only a little  Relationships  . Social connections:    Talks on phone: Twice a week    Gets together: Twice a week    Attends religious service: Never    Active member of club or organization: No    Attends meetings of clubs or organizations: Never    Relationship status: Married  . Intimate partner violence:    Fear of current or ex partner: No    Emotionally abused: No    Physically abused: No    Forced sexual activity: No  Other Topics Concern  . Not on file  Social History Narrative   Marital status:  Married x 8 years; third marriage; happily married.      Children: two children (28, 31); no grandchildren.  Several step grandchildren (9).        Lives: with wife. 1 dog.      Employment:  Retired 06/2015.  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        Tobacco: none      Alcohol: 2 glasses of wine 4x week.  Very rare intoxication.      Drugs: none      Exercise:  Walks daily for 20 minutes 1.3 miles per day; walks dog regularly.      Seatbelt: 100% of time      Guns in home: none      Sunscreen: yes; rare sun exposure.      ADLs: independent with ADLs. Drives      Advanced Directives:  YES; living will; FULL CODE; no prolonged measures.       Allergies  Allergen Reactions  . Celebrex [Celecoxib]     REACTION: Hives  . Flexeril [Cyclobenzaprine Hcl]   . Nsaids   . Sulfa Drugs Cross Reactors      Prior to Admission medications   Medication Sig Start Date End Date Taking? Authorizing Provider  citalopram (CELEXA) 20 MG tablet Take 1 tablet (20 mg total) by mouth daily. 08/02/18  Yes Rutherford Guys, MD  cromolyn (NASALCROM) 5.2 MG/ACT nasal spray Place 1 spray into the nose 2  (two) times daily.     Yes [provider]  minoxidil (ROGAINE) 2 % external solution Apply topically 2 (two) times daily.     Yes [provider]  Multiple Vitamins-Minerals (MULTIVITAMIN WITH MINERALS) tablet Take 1 tablet by mouth daily.   Yes [provider]  phenylephrine (SUDAFED PE) 10 MG TABS Take 10 mg by mouth every 4 (four) hours as needed.     Yes [provider]  aspirin 81 MG tablet Take 81 mg by mouth daily.    [provider]  FLUZONE HIGH-DOSE 0.5 ML SUSY TO BE ADMINISTERED BY PHARMACIST FOR IMMUNIZATION 02/26/15   [provider]  OVER THE COUNTER MEDICATION ubiquinal 200mg  (co q 10)    [provider]     Depression screen [facility] 2/9 09/08/2018 03/12/2018 03/12/2018 10/10/2017 04/29/2016  Decreased Interest 0 0 0 0 0  Down, Depressed, Hopeless 0 0 0 0 0  PHQ - 2 Score 0 0 0 0 0     Fall Risk  09/08/2018 03/12/2018 03/12/2018 10/10/2017 04/29/2016  Falls in the past year? 0 No No No No  Number falls in past yr: 0 - - - -  Injury with Fall? 0 - - - -  Comment - - - - -  Risk for fall due to : Impaired vision - - - -  PHYSICAL EXAM: Ht 5\' 11"  (1.803 m)   Wt 129 lb (58.5 kg)   BMI 17.99 kg/m    Wt Readings from Last 3 Encounters:  09/08/18 129 lb (58.5 kg)  03/12/18 129 lb 12.8 oz (58.9 kg)  10/10/17 130 lb (59 kg)   As this was a telephone visit, vital signs were taken from patient and no exam was conducted.     Education/Counseling provided regarding diet and exercise, prevention of chronic diseases, smoking/tobacco cessation, if applicable, and reviewed "Covered Medicare Preventive Services."   ASSESSMENT/PLAN: There are no diagnoses linked to this encounter.

## 2018-09-08 NOTE — Patient Instructions (Signed)
  Paul Sparks , Thank you for taking time to come for your Medicare Wellness Visit. I appreciate your ongoing commitment to your health goals. Please review the following plan we discussed and let me know if I can assist you in the future.   These are the goals we discussed: Goals    . Patient Stated     Pt wants to work on focus and efficiency in completing projects in personal life.        This is a list of the screening recommended for you and due dates:  Health Maintenance  Topic Date Due  . Colon Cancer Screening  11/25/2018  . Flu Shot  01/01/2019  . Tetanus Vaccine  03/24/2022  .  Hepatitis C: One time screening is recommended by Center for Disease Control  (CDC) for  adults born from [birth year] through 1967.   Completed  . Pneumonia vaccines  Completed

## 2018-09-09 NOTE — Addendum Note (Signed)
Addended by: Suszanne Finch on: 09/09/2018 09:52 AM   Modules accepted: Level of Service

## 2018-09-14 DIAGNOSIS — E0500 Thyrotoxicosis with diffuse goiter without thyrotoxic crisis or storm: Secondary | ICD-10-CM | POA: Diagnosis not present

## 2018-10-03 NOTE — Progress Notes (Signed)
I was was available for the history and physical exam. I personally discussed this patient at the time of the office visit and agree with the assessment and plan.  Delia Chimes MD

## 2018-10-05 DIAGNOSIS — E0500 Thyrotoxicosis with diffuse goiter without thyrotoxic crisis or storm: Secondary | ICD-10-CM | POA: Diagnosis not present

## 2018-10-12 ENCOUNTER — Encounter: Payer: 59 | Admitting: Family Medicine

## 2018-10-13 ENCOUNTER — Other Ambulatory Visit: Payer: Self-pay

## 2018-10-13 ENCOUNTER — Telehealth (INDEPENDENT_AMBULATORY_CARE_PROVIDER_SITE_OTHER): Payer: Medicare Other | Admitting: Family Medicine

## 2018-10-13 ENCOUNTER — Encounter: Payer: Self-pay | Admitting: Family Medicine

## 2018-10-13 DIAGNOSIS — J301 Allergic rhinitis due to pollen: Secondary | ICD-10-CM

## 2018-10-13 DIAGNOSIS — Z1211 Encounter for screening for malignant neoplasm of colon: Secondary | ICD-10-CM

## 2018-10-13 DIAGNOSIS — Z86010 Personal history of colonic polyps: Secondary | ICD-10-CM | POA: Diagnosis not present

## 2018-10-13 DIAGNOSIS — E7800 Pure hypercholesterolemia, unspecified: Secondary | ICD-10-CM | POA: Diagnosis not present

## 2018-10-13 DIAGNOSIS — H5789 Other specified disorders of eye and adnexa: Secondary | ICD-10-CM | POA: Insufficient documentation

## 2018-10-13 DIAGNOSIS — F329 Major depressive disorder, single episode, unspecified: Secondary | ICD-10-CM

## 2018-10-13 DIAGNOSIS — F419 Anxiety disorder, unspecified: Secondary | ICD-10-CM

## 2018-10-13 DIAGNOSIS — E0500 Thyrotoxicosis with diffuse goiter without thyrotoxic crisis or storm: Secondary | ICD-10-CM

## 2018-10-13 DIAGNOSIS — M5030 Other cervical disc degeneration, unspecified cervical region: Secondary | ICD-10-CM

## 2018-10-13 DIAGNOSIS — Z0001 Encounter for general adult medical examination with abnormal findings: Secondary | ICD-10-CM | POA: Diagnosis not present

## 2018-10-13 DIAGNOSIS — Z0000 Encounter for general adult medical examination without abnormal findings: Secondary | ICD-10-CM

## 2018-10-13 DIAGNOSIS — E079 Disorder of thyroid, unspecified: Secondary | ICD-10-CM | POA: Insufficient documentation

## 2018-10-13 DIAGNOSIS — F32A Depression, unspecified: Secondary | ICD-10-CM

## 2018-10-13 NOTE — Progress Notes (Signed)
Virtual Visit Note  I connected with patient on 10/13/18 at 820am by phone and verified that I am speaking with the correct person using two identifiers. Paul Sparks is currently located at home and patient is currently with them during visit. The provider, Rutherford Guys, MD is located in their office at time of visit.  I discussed the limitations, risks, security and privacy concerns of performing an evaluation and management service by telephone and the availability of in person appointments. I also discussed with the patient that there may be a patient responsible charge related to this service. The patient expressed understanding and agreed to proceed.   CC: CPE  HPI Last CPE Oct 10 2017? Colorectal Cancer Screening: June 2015, repeat 5 years Prostate Cancer Screening: 2019 HIV Screening: 2016 HCV Screening: 2016 Seasonal Influenza Vaccination: annually Td/Tdap Vaccination: 2013 Pneumococcal Vaccination: completed Zoster Vaccination: completed Frequency of Dental evaluation: Q6 months Frequency of Eye evaluation: not seeing optho regularly  Undergoing infusion treatments with tapezza which is used to treat thyroid eye disease, under care of Dr Forde Dandy, patient reports he has completed 2 infusion, goal is 8, patient reports that he is overall tolerating well, other than increased in watery stool and headaches. Feels that it is working. He is not taking any oral thyroid medications.  Denies any weight changes since last OV Exercises regularly, calisthenics and daily brisk walks 126 lbs at infusion center BP this morning 129/74, HR 71, RR 24 - taken by wife who is an Chief of Staff Readings from Last 3 Encounters:  09/08/18 129 lb (58.5 kg)  03/12/18 129 lb 12.8 oz (58.9 kg)  10/10/17 130 lb (59 kg)   Patent feels that depression is well controlled  Seasonal allergies not well controlled during this time Takes sudafed prn Prefers to not take medications for it  Allergies   Allergen Reactions  . Celebrex [Celecoxib]     REACTION: Hives  . Flexeril [Cyclobenzaprine Hcl]   . Nsaids   . Sulfa Drugs Cross Reactors     Prior to Admission medications   Medication Sig Start Date End Date Taking? Authorizing Provider  citalopram (CELEXA) 20 MG tablet Take 1 tablet (20 mg total) by mouth daily. 08/02/18  Yes Rutherford Guys, MD  cromolyn (NASALCROM) 5.2 MG/ACT nasal spray Place 1 spray into the nose 2 (two) times daily.     Yes [provider]  minoxidil (ROGAINE) 2 % external solution Apply topically 2 (two) times daily.     Yes [provider]  Multiple Vitamins-Minerals (MULTIVITAMIN WITH MINERALS) tablet Take 1 tablet by mouth daily.   Yes [provider]  OVER THE COUNTER MEDICATION ubiquinal 200mg  (co q 10)   Yes [provider]  aspirin 81 MG tablet Take 81 mg by mouth daily.    [provider]    Past Medical History:  Diagnosis Date  . Allergy   . Anxiety   . Arthritis   . BPPV (benign paroxysmal positional vertigo), unspecified laterality   . Colon polyp 11/24/2013   colonoscopy [name]. Repeat in 5 years.  . Dermatofibroma of back 12/11/10   [name].  . Eczema 12/11/10   [name].  . Genital herpes   . IBD (inflammatory bowel disease)    diarrhea, abdominal cramping.  . Inguinal hernia    Bilateral; s/p repair.  . Prostate enlargement    mildly and asymptomatic  . Seborrheic keratosis 12/11/10   [name]/Dermatology consult  . Wears glasses  Past Surgical History:  Procedure Laterality Date  . COLONOSCOPY  11/24/2013   four polyp. [name], MD.  Repeat in 5 years.  . INGUINAL HERNIA REPAIR  2005   right  . KNEE ARTHROSCOPY    . TONSILLECTOMY    . VASECTOMY      Social History   Tobacco Use  . Smoking status: Never Smoker  . Smokeless tobacco: Never Used  Substance Use Topics  . Alcohol use: Yes    Alcohol/week: 2.0 standard drinks    Types: 2 Glasses  of wine per week    Comment: 2 glasses of wine with meals 4x per week    Family History  Problem Relation Age of Onset  . Dementia Mother        Multi-infarct dementia  . Stroke Mother   . Diabetes Father   . Cancer Paternal Grandfather        lung  . Colon cancer Neg Hx   . Pancreatic cancer Neg Hx   . Rectal cancer Neg Hx   . Stomach cancer Neg Hx     Review of Systems  Constitutional: Negative for chills and fever.  HENT: Positive for congestion.   Respiratory: Negative for cough and shortness of breath.   Cardiovascular: Negative for chest pain, palpitations and leg swelling.  Gastrointestinal: Positive for diarrhea. Negative for abdominal pain, blood in stool, melena, nausea and vomiting.  Genitourinary: Negative for frequency and urgency.  Musculoskeletal: Positive for back pain (known DDD) and joint pain (both knees, known OA). Negative for myalgias.  Skin: Negative for rash.  Neurological: Positive for headaches. Negative for dizziness, tingling and focal weakness.  Endo/Heme/Allergies: Positive for environmental allergies.  Psychiatric/Behavioral: Negative for depression (well controlled). The patient is not nervous/anxious and does not have insomnia.   per hpi  Objective  Vitals as reported by the patient: per above   ASSESSMENT and PLAN  1. Annual physical exam Routine HCM labs ordered. HCM reviewed/discussed. Anticipatory guidance regarding healthy weight, lifestyle and choices given.   2. Screening for colon cancer 3. Hx of colonic polyp - Ambulatory referral to Gastroenterology  4. Pure hypercholesterolemia Continue with LFM.  - Lipid panel; Future - Comprehensive metabolic panel; Future - CBC; Future  5. Thyroid eye disease Managed by endo, Dr Forde Dandy, currently under treatment with tapezza  6. Seasonal allergic rhinitis due to pollen Stable.  7. Anxiety and depression Controlled. Continue with celexa  FOLLOW-UP: 6 months   The above  assessment and management plan was discussed with the patient. The patient verbalized understanding of and has agreed to the management plan. Patient is aware to call the clinic if symptoms persist or worsen. Patient is aware when to return to the clinic for a follow-up visit. Patient educated on when it is appropriate to go to the emergency department.    I provided 23 minutes of non-face-to-face time during this encounter.  Rutherford Guys, MD [facility] [place], [place] [address] Ph.  [phone] Fax [phone]

## 2018-10-13 NOTE — Patient Instructions (Signed)
Preventive Care 2 Years and Older, Male Preventive care refers to lifestyle choices and visits with your health care provider that can promote health and wellness. What does preventive care include?   A yearly physical exam. This is also called an annual well check.  Dental exams once or twice a year.  Routine eye exams. Ask your health care provider how often you should have your eyes checked.  Personal lifestyle choices, including: ? Daily care of your teeth and gums. ? Regular physical activity. ? Eating a healthy diet. ? Avoiding tobacco and drug use. ? Limiting alcohol use. ? Practicing safe sex. ? Taking low doses of aspirin every day. ? Taking vitamin and mineral supplements as recommended by your health care provider. What happens during an annual well check? The services and screenings done by your health care provider during your annual well check will depend on your age, overall health, lifestyle risk factors, and family history of disease. Counseling Your health care provider may ask you questions about your:  Alcohol use.  Tobacco use.  Drug use.  Emotional well-being.  Home and relationship well-being.  Sexual activity.  Eating habits.  History of falls.  Memory and ability to understand (cognition).  Work and work Statistician. Screening You may have the following tests or measurements:  Height, weight, and BMI.  Blood pressure.  Lipid and cholesterol levels. These may be checked every 5 years, or more frequently if you are over 9 years old.  Skin check.  Lung cancer screening. You may have this screening every year starting at age 57 if you have a 30-pack-year history of smoking and currently smoke or have quit within the past 15 years.  Colorectal cancer screening. All adults should have this screening starting at age 90 and continuing until age 69. You will have tests every 1-10 years, depending on your results and the type of screening  test. People at increased risk should start screening at an earlier age. Screening tests may include: ? Guaiac-based fecal occult blood testing. ? Fecal immunochemical test (FIT). ? Stool DNA test. ? Virtual colonoscopy. ? Sigmoidoscopy. During this test, a flexible tube with a tiny camera (sigmoidoscope) is used to examine your rectum and lower colon. The sigmoidoscope is inserted through your anus into your rectum and lower colon. ? Colonoscopy. During this test, a long, thin, flexible tube with a tiny camera (colonoscope) is used to examine your entire colon and rectum.  Prostate cancer screening. Recommendations will vary depending on your family history and other risks.  Hepatitis C blood test.  Hepatitis B blood test.  Sexually transmitted disease (STD) testing.  Diabetes screening. This is done by checking your blood sugar (glucose) after you have not eaten for a while (fasting). You may have this done every 1-3 years.  Abdominal aortic aneurysm (AAA) screening. You may need this if you are a current or former smoker.  Osteoporosis. You may be screened starting at age 30 if you are at high risk. Talk with your health care provider about your test results, treatment options, and if necessary, the need for more tests. Vaccines Your health care provider may recommend certain vaccines, such as:  Influenza vaccine. This is recommended every year.  Tetanus, diphtheria, and acellular pertussis (Tdap, Td) vaccine. You may need a Td booster every 10 years.  Varicella vaccine. You may need this if you have not been vaccinated.  Zoster vaccine. You may need this after age 42.  Measles, mumps, and rubella (MMR) vaccine.  You may need at least one dose of MMR if you were born in 1957 or later. You may also need a second dose.  Pneumococcal 13-valent conjugate (PCV13) vaccine. One dose is recommended after age 65.  Pneumococcal polysaccharide (PPSV23) vaccine. One dose is recommended  after age 65.  Meningococcal vaccine. You may need this if you have certain conditions.  Hepatitis A vaccine. You may need this if you have certain conditions or if you travel or work in places where you may be exposed to hepatitis A.  Hepatitis B vaccine. You may need this if you have certain conditions or if you travel or work in places where you may be exposed to hepatitis B.  Haemophilus influenzae type b (Hib) vaccine. You may need this if you have certain risk factors. Talk to your health care provider about which screenings and vaccines you need and how often you need them. This information is not intended to replace advice given to you by your health care provider. Make sure you discuss any questions you have with your health care provider. Document Released: 06/15/2015 Document Revised: 07/09/2017 Document Reviewed: 03/20/2015 Elsevier Interactive Patient Education  2019 Elsevier Inc.  

## 2018-10-13 NOTE — Progress Notes (Signed)
Pt c/o CPE.

## 2018-10-26 DIAGNOSIS — E0500 Thyrotoxicosis with diffuse goiter without thyrotoxic crisis or storm: Secondary | ICD-10-CM | POA: Diagnosis not present

## 2018-11-16 DIAGNOSIS — E0500 Thyrotoxicosis with diffuse goiter without thyrotoxic crisis or storm: Secondary | ICD-10-CM | POA: Diagnosis not present

## 2018-12-07 DIAGNOSIS — E0500 Thyrotoxicosis with diffuse goiter without thyrotoxic crisis or storm: Secondary | ICD-10-CM | POA: Diagnosis not present

## 2018-12-16 ENCOUNTER — Encounter: Payer: Self-pay | Admitting: Family Medicine

## 2018-12-28 DIAGNOSIS — E0500 Thyrotoxicosis with diffuse goiter without thyrotoxic crisis or storm: Secondary | ICD-10-CM | POA: Diagnosis not present

## 2018-12-31 ENCOUNTER — Encounter: Payer: Self-pay | Admitting: Gastroenterology

## 2019-01-11 ENCOUNTER — Other Ambulatory Visit: Payer: Self-pay | Admitting: Family Medicine

## 2019-01-18 DIAGNOSIS — E0500 Thyrotoxicosis with diffuse goiter without thyrotoxic crisis or storm: Secondary | ICD-10-CM | POA: Diagnosis not present

## 2019-02-02 ENCOUNTER — Ambulatory Visit (AMBULATORY_SURGERY_CENTER): Payer: Self-pay | Admitting: *Deleted

## 2019-02-02 ENCOUNTER — Other Ambulatory Visit: Payer: Self-pay

## 2019-02-02 VITALS — Temp 96.9°F | Ht 71.0 in | Wt 129.0 lb

## 2019-02-02 DIAGNOSIS — Z86010 Personal history of colonic polyps: Secondary | ICD-10-CM

## 2019-02-02 MED ORDER — NA SULFATE-K SULFATE-MG SULF 17.5-3.13-1.6 GM/177ML PO SOLN: ORAL | 0 refills | Status: DC

## 2019-02-02 NOTE — Progress Notes (Signed)
Patient is here in-person for PV. Patient denies any allergies to eggs or soy. Patient denies any problems with anesthesia/sedation. Patient denies any oxygen use at home. Patient denies taking any diet/weight loss medications or blood thinners. EMMI education assisgned to patient on colonoscopy, this was explained and instructions given to patient.Pt is aware that care partner will wait in the car during procedure; if they feel like they will be too hot to wait in the car; they may wait in the lobby.  We want them to wear a mask (we do not have any that we can provide them), practice social distancing, and we will check their temperatures when they get here.  I did remind patient that their care partner needs to stay in the parking lot the entire time. Pt will wear mask into building. 

## 2019-02-08 DIAGNOSIS — E0500 Thyrotoxicosis with diffuse goiter without thyrotoxic crisis or storm: Secondary | ICD-10-CM | POA: Diagnosis not present

## 2019-02-09 ENCOUNTER — Encounter: Payer: Self-pay | Admitting: Gastroenterology

## 2019-02-18 ENCOUNTER — Telehealth: Payer: Self-pay | Admitting: Gastroenterology

## 2019-02-18 NOTE — Telephone Encounter (Signed)
Pt had a PV 02-02-2019 and this is not noted at [facility]   Pt has been getting Petezaa infusions for thyroid eye disease - Last infusion was 02-08-2019- has been getting these for the last 6 months   Drug company told pt there should not be any problems with having colon- he wants to double check with you.  Colon is 9-23 WED   Please advise if okay to proceed with colon as scheduled   thanks for your time, Lelan Pons

## 2019-02-19 NOTE — Telephone Encounter (Signed)
Regarding Paul Sparks - use of this medication will not interfere with colonoscopy however I recommend avoiding an infusion on the day of clear liquids/bowel prep or on the day of colonoscopy.

## 2019-02-21 NOTE — Telephone Encounter (Signed)
Called pt- LMTRC to me - Lelan Pons PV

## 2019-02-21 NOTE — Telephone Encounter (Signed)
Pt instructed as per MD- pt states he has completed all infusions Paul Sparks PV

## 2019-02-22 ENCOUNTER — Telehealth: Payer: Self-pay

## 2019-02-22 NOTE — Telephone Encounter (Signed)
Covid-19 screening questions   Do you now or have you had a fever in the last 14 days?  Do you have any respiratory symptoms of shortness of breath or cough now or in the last 14 days?  Do you have any family members or close contacts with diagnosed or suspected Covid-19 in the past 14 days?  Have you been tested for Covid-19 and found to be positive?       

## 2019-02-22 NOTE — Telephone Encounter (Signed)
Pt responded "no" to all screening questions °

## 2019-02-23 ENCOUNTER — Other Ambulatory Visit: Payer: Self-pay | Admitting: Gastroenterology

## 2019-02-23 ENCOUNTER — Other Ambulatory Visit: Payer: Self-pay

## 2019-02-23 ENCOUNTER — Encounter: Payer: Self-pay | Admitting: Gastroenterology

## 2019-02-23 ENCOUNTER — Ambulatory Visit (AMBULATORY_SURGERY_CENTER): Payer: Medicare Other | Admitting: Gastroenterology

## 2019-02-23 VITALS — BP 105/69 | HR 65 | Temp 98.2°F | Resp 18 | Ht 71.0 in | Wt 129.0 lb

## 2019-02-23 DIAGNOSIS — Z1211 Encounter for screening for malignant neoplasm of colon: Secondary | ICD-10-CM | POA: Diagnosis not present

## 2019-02-23 DIAGNOSIS — K635 Polyp of colon: Secondary | ICD-10-CM | POA: Diagnosis not present

## 2019-02-23 DIAGNOSIS — Z86010 Personal history of colonic polyps: Secondary | ICD-10-CM | POA: Diagnosis not present

## 2019-02-23 DIAGNOSIS — D123 Benign neoplasm of transverse colon: Secondary | ICD-10-CM

## 2019-02-23 MED ORDER — SODIUM CHLORIDE 0.9 % IV SOLN: 500.0000 mL | Freq: Once | INTRAVENOUS | Status: DC

## 2019-02-23 NOTE — Progress Notes (Signed)
Temperature taken by K.A., VS taken by C.W. 

## 2019-02-23 NOTE — Op Note (Signed)
[facility] Patient Name: Paul Sparks Procedure Date: 02/23/2019 9:19 AM MRN: [id number] Endoscopist: Ladene Artist , MD Age: 68 Referring MD:  Date of Birth: 21-Feb-1951 Gender: Male Account #: [id number] Procedure:                Colonoscopy Indications:              Surveillance: Personal history of adenomatous                            polyps and SSP on last colonoscopy 5 years ago Medicines:                Monitored Anesthesia Care Procedure:                Pre-Anesthesia Assessment:                           - Prior to the procedure, a History and Physical                            was performed, and patient medications and                            allergies were reviewed. The patient's tolerance of                            previous anesthesia was also reviewed. The risks                            and benefits of the procedure and the sedation                            options and risks were discussed with the patient.                            All questions were answered, and informed consent                            was obtained. Prior Anticoagulants: The patient has                            taken no previous anticoagulant or antiplatelet                            agents. ASA Grade Assessment: II - A patient with                            mild systemic disease. After reviewing the risks                            and benefits, the patient was deemed in                            satisfactory condition to undergo the procedure.  After obtaining informed consent, the colonoscope                            was passed under direct vision. Throughout the                            procedure, the patient's blood pressure, pulse, and                            oxygen saturations were monitored continuously. The                            Colonoscope was introduced through the anus and                            advanced to the the  cecum, identified by                            appendiceal orifice and ileocecal valve. The                            ileocecal valve, appendiceal orifice, and rectum                            were photographed. The quality of the bowel                            preparation was good. The patient tolerated the                            procedure well. The colonoscopy was somewhat                            difficult due to a redundant colon, significant                            looping and a tortuous colon. Successful completion                            of the procedure was aided by straightening and                            shortening the scope to obtain bowel loop                            reduction, using scope torsion and applying                            abdominal pressure. Scope In: 9:22:21 AM Scope Out: 9:44:25 AM Scope Withdrawal Time: 0 hours 12 minutes 40 seconds  Total Procedure Duration: 0 hours 22 minutes 4 seconds  Findings:                 The perianal and digital rectal examinations were  normal.                           A 8 mm polyp was found in the transverse colon. The                            polyp was sessile. The polyp was removed with a                            cold snare. Resection and retrieval were complete.                           Multiple small-mouthed diverticula were found in                            the left colon. There was no evidence of                            diverticular bleeding.                           Internal hemorrhoids were found during                            retroflexion. The hemorrhoids were small and Grade                            I (internal hemorrhoids that do not prolapse).                           The exam was otherwise without abnormality on                            direct and retroflexion views. Complications:            No immediate complications. Estimated blood loss:                             None. Estimated Blood Loss:     Estimated blood loss: none. Impression:               - One 8 mm polyp in the transverse colon, removed                            with a cold snare. Resected and retrieved.                           - Mild diverticulosis in the left colon.                           - Internal hemorrhoids.                           - The examination was otherwise normal on direct  and retroflexion views. Recommendation:           - Repeat colonoscopy in 5 years for surveillance.                           - Patient has a contact number available for                            emergencies. The signs and symptoms of potential                            delayed complications were discussed with the                            patient. Return to normal activities tomorrow.                            Written discharge instructions were provided to the                            patient.                           - Resume previous diet.                           - Continue present medications.                           - Await pathology results. Ladene Artist, MD 02/23/2019 9:50:42 AM This report has been signed electronically.

## 2019-02-23 NOTE — Progress Notes (Signed)
Called to room to assist during endoscopic procedure.  Patient ID and intended procedure confirmed with present staff. Received instructions for my participation in the procedure from the performing physician.  

## 2019-02-23 NOTE — Patient Instructions (Signed)
Handouts given for polyps, diverticulosis and hemorrhoids  YOU HAD AN ENDOSCOPIC PROCEDURE TODAY AT THE [facility]:   Refer to the procedure report that was given to you for any specific questions about what was found during the examination.  If the procedure report does not answer your questions, please call your gastroenterologist to clarify.  If you requested that your care partner not be given the details of your procedure findings, then the procedure report has been included in a sealed envelope for you to review at your convenience later.  YOU SHOULD EXPECT: Some feelings of bloating in the abdomen. Passage of more gas than usual.  Walking can help get rid of the air that was put into your GI tract during the procedure and reduce the bloating. If you had a lower endoscopy (such as a colonoscopy or flexible sigmoidoscopy) you may notice spotting of blood in your stool or on the toilet paper. If you underwent a bowel prep for your procedure, you may not have a normal bowel movement for a few days.  Please Note:  You might notice some irritation and congestion in your nose or some drainage.  This is from the oxygen used during your procedure.  There is no need for concern and it should clear up in a day or so.  SYMPTOMS TO REPORT IMMEDIATELY:   Following lower endoscopy (colonoscopy or flexible sigmoidoscopy):  Excessive amounts of blood in the stool  Significant tenderness or worsening of abdominal pains  Swelling of the abdomen that is new, acute  Fever of 100F or higher  For urgent or emergent issues, a gastroenterologist can be reached at any hour by calling [phone].   DIET:  We do recommend a small meal at first, but then you may proceed to your regular diet.  Drink plenty of fluids but you should avoid alcoholic beverages for 24 hours.  ACTIVITY:  You should plan to take it easy for the rest of today and you should NOT DRIVE or use heavy machinery until tomorrow  (because of the sedation medicines used during the test).    FOLLOW UP: Our staff will call the number listed on your records 48-72 hours following your procedure to check on you and address any questions or concerns that you may have regarding the information given to you following your procedure. If we do not reach you, we will leave a message.  We will attempt to reach you two times.  During this call, we will ask if you have developed any symptoms of COVID 19. If you develop any symptoms (ie: fever, flu-like symptoms, shortness of breath, cough etc.) before then, please call [phone].  If you test positive for Covid 19 in the 2 weeks post procedure, please call and report this information to us.    If any biopsies were taken you will be contacted by phone or by letter within the next 1-3 weeks.  Please call us at [phone] if you have not heard about the biopsies in 3 weeks.    SIGNATURES/CONFIDENTIALITY: You and/or your care partner have signed paperwork which will be entered into your electronic medical record.  These signatures attest to the fact that that the information above on your After Visit Summary has been reviewed and is understood.  Full responsibility of the confidentiality of this discharge information lies with you and/or your care-partner. 

## 2019-02-23 NOTE — Progress Notes (Signed)
Pt's states no medical or surgical changes since previsit or office visit. 

## 2019-02-23 NOTE — Progress Notes (Signed)
To PACU, VSS. Report to [email or web address] 

## 2019-02-24 DIAGNOSIS — H0520 Unspecified exophthalmos: Secondary | ICD-10-CM | POA: Diagnosis not present

## 2019-02-24 DIAGNOSIS — E0500 Thyrotoxicosis with diffuse goiter without thyrotoxic crisis or storm: Secondary | ICD-10-CM | POA: Diagnosis not present

## 2019-02-24 DIAGNOSIS — E785 Hyperlipidemia, unspecified: Secondary | ICD-10-CM | POA: Diagnosis not present

## 2019-02-24 DIAGNOSIS — K589 Irritable bowel syndrome without diarrhea: Secondary | ICD-10-CM | POA: Diagnosis not present

## 2019-02-25 ENCOUNTER — Telehealth: Payer: Self-pay | Admitting: *Deleted

## 2019-02-25 NOTE — Telephone Encounter (Signed)
First attempt, left VM.  

## 2019-02-25 NOTE — Telephone Encounter (Signed)
  Follow up Call-  Call back number 02/23/2019  Post procedure Call Back phone  # [phone]  Permission to leave phone message Yes  Some recent data might be hidden     Patient questions:  Do you have a fever, pain , or abdominal swelling? No. Pain Score  0 *  Have you tolerated food without any problems? Yes.    Have you been able to return to your normal activities? Yes.    Do you have any questions about your discharge instructions: Diet   No. Medications  No. Follow up visit  No.  Do you have questions or concerns about your Care? No.  Actions: * If pain score is 4 or above: No action needed, pain <4.  1. Have you developed a fever since your procedure? NO  2.   Have you had an respiratory symptoms (SOB or cough) since your procedure? NO  3.   Have you tested positive for COVID 19 since your procedure NO  4.   Have you had any family members/close contacts diagnosed with the COVID 19 since your procedure?  NO  If yes to any of these questions please route to Joylene John, RN and Alphonsa Gin, RN.

## 2019-02-28 ENCOUNTER — Other Ambulatory Visit: Payer: Self-pay

## 2019-02-28 ENCOUNTER — Ambulatory Visit (INDEPENDENT_AMBULATORY_CARE_PROVIDER_SITE_OTHER): Payer: Medicare Other | Admitting: Family Medicine

## 2019-02-28 DIAGNOSIS — Z23 Encounter for immunization: Secondary | ICD-10-CM | POA: Diagnosis not present

## 2019-03-04 ENCOUNTER — Encounter: Payer: Self-pay | Admitting: Gastroenterology

## 2019-05-25 ENCOUNTER — Telehealth: Payer: Self-pay | Admitting: Family Medicine

## 2019-05-25 NOTE — Telephone Encounter (Signed)
Medication Refill - Medication: Hyoscyamine Sulfate (HYOSCYAMINE PO)  Originally prescribed by Sonia Baller  Has the patient contacted their pharmacy? Yes.   (Agent: If no, request that the patient contact the pharmacy for the refill.) (Agent: If yes, when and what did the pharmacy advise?)  Preferred Pharmacy (with phone number or street name):  [place], [place] [employer] Phone:  [phone]  Fax:  [phone]       Agent: Please be advised that RX refills may take up to 3 business days. We ask that you follow-up with your pharmacy.

## 2019-05-25 NOTE — Telephone Encounter (Signed)
Unable to order per protocol due to historical provider; will route to office for final disposition. Medication Refill - Medication: Hyoscyamine Sulfate (HYOSCYAMINE PO)  Originally prescribed by Sonia Baller  Has the patient contacted their pharmacy? Yes.   (Agent: If no, request that the patient contact the pharmacy for the refill.) (Agent: If yes, when and what did the pharmacy advise?)  Preferred Pharmacy (with phone number or street name):  [place] [place] [employer] [facility] Phone:  [phone]  Fax:  [phone]       Agent: Please be advised that RX refills may take up to 3 business days. We ask that you follow-up with your pharmacy.

## 2019-05-25 NOTE — Telephone Encounter (Signed)
Pt please schedule pt for f/u appt. Pt was last seen 10/2018.  Dr. Are you willing to refill medication after an appointment is scheduled?

## 2019-05-26 MED ORDER — HYOSCYAMINE SULFATE 0.125 MG PO TABS: 0.1250 mg | ORAL_TABLET | ORAL | 2 refills | Status: DC | PRN

## 2019-06-16 NOTE — Telephone Encounter (Signed)
Called pt NO voice mail set up  FR

## 2019-06-20 ENCOUNTER — Other Ambulatory Visit: Payer: Self-pay

## 2019-06-20 ENCOUNTER — Telehealth (INDEPENDENT_AMBULATORY_CARE_PROVIDER_SITE_OTHER): Payer: Medicare Other | Admitting: Family Medicine

## 2019-06-20 DIAGNOSIS — K580 Irritable bowel syndrome with diarrhea: Secondary | ICD-10-CM | POA: Diagnosis not present

## 2019-06-20 DIAGNOSIS — F419 Anxiety disorder, unspecified: Secondary | ICD-10-CM

## 2019-06-20 DIAGNOSIS — E0500 Thyrotoxicosis with diffuse goiter without thyrotoxic crisis or storm: Secondary | ICD-10-CM

## 2019-06-20 DIAGNOSIS — H5789 Other specified disorders of eye and adnexa: Secondary | ICD-10-CM

## 2019-06-20 DIAGNOSIS — F329 Major depressive disorder, single episode, unspecified: Secondary | ICD-10-CM | POA: Diagnosis not present

## 2019-06-20 MED ORDER — HYOSCYAMINE SULFATE 0.125 MG PO TABS: 0.1250 mg | ORAL_TABLET | ORAL | 4 refills | Status: DC | PRN

## 2019-06-20 MED ORDER — CITALOPRAM HYDROBROMIDE 40 MG PO TABS: 40.0000 mg | ORAL_TABLET | Freq: Every day | ORAL | 1 refills | Status: DC

## 2019-06-20 NOTE — Progress Notes (Signed)
CC: med refills needed for celexa, hyoscyamine and pseudoephedrine.  Pt has concerns about possible  side effects from the tepezza that have now morphed into other things and are not going away.  Per pt he has blueness to his fingernails, nails have broken down and cracking and rolling up and are ridged horizontally. No travel outside the [place] or [place] in the past 3 weeks.  No recent weight or bp taken.

## 2019-06-20 NOTE — Patient Instructions (Signed)
° ° ° °  If you have lab work done today you will be contacted with your lab results within the next 2 weeks.  If you have not heard from us then please contact us. The fastest way to get your results is to register for My Chart. ° ° °IF you received an x-ray today, you will receive an invoice from [facility]. Please contact [facility] at [phone] with questions or concerns regarding your invoice.  ° °IF you received labwork today, you will receive an invoice from [facility]. Please contact [facility] at [phone] with questions or concerns regarding your invoice.  ° °Our billing staff will not be able to assist you with questions regarding bills from these companies. ° °You will be contacted with the lab results as soon as they are available. The fastest way to get your results is to activate your My Chart account. Instructions are located on the last page of this paperwork. If you have not heard from us regarding the results in 2 weeks, please contact this office. °  ° ° ° °

## 2019-06-20 NOTE — Progress Notes (Signed)
Virtual Visit Note  I connected with patient on 06/20/19 at 613pm by and video doximity verified that I am speaking with the correct person using two identifiers. Paul Sparks is currently located at home and patient is currently with them during visit. The provider, Rutherford Guys, MD is located in their office at time of visit.  I discussed the limitations, risks, security and privacy concerns of performing an evaluation and management service by telephone and the availability of in person appointments. I also discussed with the patient that there may be a patient responsible charge related to this service. The patient expressed understanding and agreed to proceed.   CC: medication refill  HPI ? celexa - takes for anxiety and depression He reports that his wife has noticed sign improvement, he however does not feel much different  Hyoscyamine - takes prn for flare ups Since tapezza infusions, his IBD has not been controlled, taking once every other day  Completed tapezza infusion regimes His nails are falling off His eyes did get better, left eye still mildly protruding Will be seeing neuro ophtho He has no upcoming appt with Dr Forde Dandy   Allergies  Allergen Reactions  . Celebrex [Celecoxib] Hives    REACTION: Hives  . Flexeril [Cyclobenzaprine Hcl] Other (See Comments)    arrhythmias   . Nsaids Other (See Comments)    Unknown reaction per pt  . Sulfa Drugs Cross Reactors Hives    Prior to Admission medications   Medication Sig Start Date End Date Taking? Authorizing Provider  citalopram (CELEXA) 20 MG tablet TAKE 1 TABLET BY MOUTH  DAILY 01/12/19  Yes Rutherford Guys, MD  hyoscyamine (LEVSIN) 0.125 MG tablet Take 1 tablet (0.125 mg total) by mouth every 4 (four) hours as needed. 05/26/19  Yes Rutherford Guys, MD  Multiple Vitamins-Minerals (MULTIVITAMIN WITH MINERALS) tablet Take 1 tablet by mouth daily.   Yes [provider]  OVER THE COUNTER MEDICATION  Throat Coat Tea=HERBAL=cherry bark,licorice root,orange peel, elm bark, cinnamon bark, etc..   Yes [provider]  pseudoephedrine (SUDAFED) 30 MG tablet Take 30 mg by mouth daily.   Yes [provider]  Teprotumumab-trbw (TEPEZZA IV) Inject into the vein.    [provider]    Past Medical History:  Diagnosis Date  . Allergy   . Anxiety   . Arthritis   . BPPV (benign paroxysmal positional vertigo), unspecified laterality   . Colon polyp 11/24/2013   colonoscopy [name]. Repeat in 5 years.  . Dermatofibroma of back 12/11/10   [name].  . Eczema 12/11/10   [name].  . Genital herpes   . IBD (inflammatory bowel disease)    diarrhea, abdominal cramping.  . Inguinal hernia    Bilateral; s/p repair.  . Prostate enlargement    mildly and asymptomatic  . Seborrheic keratosis 12/11/10   [name]/Dermatology consult  . Thyroid disease   . Wears glasses     Past Surgical History:  Procedure Laterality Date  . COLONOSCOPY  11/24/2013   four polyp. [name], MD.  Repeat in 5 years.  . INGUINAL HERNIA REPAIR  2005   right  . KNEE ARTHROSCOPY    . POLYPECTOMY    . TONSILLECTOMY    . VASECTOMY      Social History   Tobacco Use  . Smoking status: Never Smoker  . Smokeless tobacco: Never Used  Substance Use Topics  . Alcohol use: Yes    Alcohol/week: 10.0 standard drinks  Types: 10 Glasses of wine per week    Family History  Problem Relation Age of Onset  . Dementia Mother        Multi-infarct dementia  . Stroke Mother   . Diabetes Father   . Cancer Paternal Grandfather        lung  . Colon cancer Neg Hx   . Pancreatic cancer Neg Hx   . Rectal cancer Neg Hx   . Stomach cancer Neg Hx   . Colon polyps Neg Hx     ROS Per hpi  Objective  Vitals as reported by the patient:  Temp 97.4 BP 107/61 HR 60 RR 16 Wt 129 lbs  Gen: AAOx3, NAD, well appearing Breathing comfortably, speaking in full  sentences  ASSESSMENT and PLAN  1. Anxiety and depression Not controlled. Increase celexa to 40mg  daily  2. Irritable bowel syndrome with diarrhea Recently uncontrolled. Continue with symptomatic treatment and dietary changes.   3. Thyroid eye disease Completed tapezza infusions. Managed by endo, upcoming appt with neuro ophtho  Other orders - citalopram (CELEXA) 40 MG tablet; Take 1 tablet (40 mg total) by mouth daily. - hyoscyamine (LEVSIN) 0.125 MG tablet; Take 1 tablet (0.125 mg total) by mouth every 4 (four) hours as needed.  FOLLOW-UP: 3 months   The above assessment and management plan was discussed with the patient. The patient verbalized understanding of and has agreed to the management plan. Patient is aware to call the clinic if symptoms persist or worsen. Patient is aware when to return to the clinic for a follow-up visit. Patient educated on when it is appropriate to go to the emergency department.    I provided 14 minutes of non-face-to-face time during this encounter.  Rutherford Guys, MD Primary [facility] at [place], [place] [address] Ph.  [phone] Fax [phone]

## 2019-07-10 ENCOUNTER — Ambulatory Visit: Payer: Medicare Other | Attending: Internal Medicine

## 2019-07-10 DIAGNOSIS — Z23 Encounter for immunization: Secondary | ICD-10-CM | POA: Insufficient documentation

## 2019-07-10 NOTE — Progress Notes (Signed)
   Covid-19 Vaccination Clinic  Name:  CALLOWAY LISZKA    MRN: [id number] DOB: April 24, 1951  07/10/2019  Mr. Handley was observed post Covid-19 immunization for 15 minutes without incidence. He was provided with Vaccine Information Sheet and instruction to access the V-Safe system.   Mr. Duque was instructed to call 911 with any severe reactions post vaccine: Marland Kitchen Difficulty breathing  . Swelling of your face and throat  . A fast heartbeat  . A bad rash all over your body  . Dizziness and weakness    Immunizations Administered    Name Date Dose VIS Date Route   Pfizer COVID-19 Vaccine 07/10/2019  4:07 PM 0.3 mL 05/13/2019 Intramuscular   Manufacturer: [place]   Lot: [id number]   [place]: [id number]

## 2019-07-31 ENCOUNTER — Ambulatory Visit: Payer: Medicare Other

## 2019-08-04 ENCOUNTER — Ambulatory Visit: Payer: Medicare Other | Attending: Internal Medicine

## 2019-08-04 DIAGNOSIS — Z23 Encounter for immunization: Secondary | ICD-10-CM | POA: Insufficient documentation

## 2019-08-04 NOTE — Progress Notes (Signed)
   Covid-19 Vaccination Clinic  Name:  ETHEL MARA    MRN: [id number] DOB: 1950-12-26  08/04/2019  Mr. Rigo was observed post Covid-19 immunization for 15 minutes without incident. He was provided with Vaccine Information Sheet and instruction to access the V-Safe system.   Mr. Fukuda was instructed to call 911 with any severe reactions post vaccine: Marland Kitchen Difficulty breathing  . Swelling of face and throat  . A fast heartbeat  . A bad rash all over body  . Dizziness and weakness   Immunizations Administered    Name Date Dose VIS Date Route   Pfizer COVID-19 Vaccine 08/04/2019 11:08 AM 0.3 mL 05/13/2019 Intramuscular   Manufacturer: [place]   Lot: [id number]   [place]: [id number]

## 2019-10-24 ENCOUNTER — Ambulatory Visit (INDEPENDENT_AMBULATORY_CARE_PROVIDER_SITE_OTHER): Payer: Medicare Other | Admitting: Family Medicine

## 2019-10-24 VITALS — BP 105/69 | Ht 71.0 in | Wt 129.0 lb

## 2019-10-24 DIAGNOSIS — Z0000 Encounter for general adult medical examination without abnormal findings: Secondary | ICD-10-CM

## 2019-10-24 NOTE — Progress Notes (Signed)
Presents today for [employer] Visit   Date of last exam: 06/20/2019  Interpreter used for this visit? No  I connected with  Paul Sparks. on 10/24/19 by a telephone  and verified that I am speaking with the correct person using two identifiers.   I discussed the limitations of evaluation and management by telemedicine. The patient expressed understanding and agreed to proceed.    Patient Care Team: Rutherford Guys, MD as PCP - General (Family Medicine)   Other items to address today:   Discussed Eye Dental Discussed immunizations   Other Screening: Last screening for diabetes: 10/10/2017 Last lipid screening: 10-10-2017  ADVANCE DIRECTIVES: Discussed: yes On File: no Materials Provided: no  Immunization status:  Immunization History  Administered Date(s) Administered  . Fluad Quad(high Dose 65+) 02/28/2019  . Influenza Split 11/15/2011, 01/31/2013  . Influenza, High Dose Seasonal PF 02/25/2018  . Influenza,inj,Quad PF,6+ Mos 02/13/2016  . Influenza-Unspecified 02/24/2014, 02/24/2017  . PFIZER SARS-COV-2 Vaccination 07/10/2019, 08/04/2019  . Pneumococcal Conjugate-13 04/17/2014  . Pneumococcal Polysaccharide-23 04/20/2015  . Tdap 03/24/2012  . Zoster 05/02/2012     There are no preventive care reminders to display for this patient.   Functional Status Survey:     6CIT Screen 10/24/2019 09/08/2018  What Year? 0 points 0 points  What month? 0 points 0 points  What time? 0 points 0 points  Count back from 20 0 points 0 points  Months in reverse 0 points 0 points  Repeat phrase 0 points 0 points  Total Score 0 0        Clinical Support from 10/24/2019 in [facility] at [facility]  AUDIT-C Score  4       Home Environment:   Lives in two story home No trouble climbing No grabs bars No scattered rugs Adequate lighting/ no clutter Patient Active Problem List   Diagnosis Date Noted  . Thyroid eye disease 10/13/2018  . Anxiety and  depression 12/20/2017  . Leukocytopenia 04/20/2015  . Genital herpes 04/17/2014  . Leukocytopenia, unspecified 08/02/2012  . Degenerative disc disease, lumbar 08/02/2012  . Degenerative disc disease, cervical 08/02/2012  . Folate deficiency 04/27/2012  . Pure hypercholesterolemia 04/27/2012  . Irritable bowel syndrome with diarrhea 03/24/2012  . Skin lesion 03/24/2012  . Allergic rhinitis 03/24/2012  . Inguinal hernia 12/10/2010  . Prostate enlargement   . GERD 09/21/2008     Past Medical History:  Diagnosis Date  . Allergy   . Anxiety   . Arthritis   . BPPV (benign paroxysmal positional vertigo), unspecified laterality   . Colon polyp 11/24/2013   colonoscopy [name]. Repeat in 5 years.  . Dermatofibroma of back 12/11/10   [name].  . Eczema 12/11/10   [name].  . Genital herpes   . IBD (inflammatory bowel disease)    diarrhea, abdominal cramping.  . Inguinal hernia    Bilateral; s/p repair.  . Prostate enlargement    mildly and asymptomatic  . Seborrheic keratosis 12/11/10   [name]/Dermatology consult  . Thyroid disease   . Wears glasses      Past Surgical History:  Procedure Laterality Date  . COLONOSCOPY  11/24/2013   four polyp. [name], MD.  Repeat in 5 years.  . INGUINAL HERNIA REPAIR  2005   right  . KNEE ARTHROSCOPY    . POLYPECTOMY    . TONSILLECTOMY    . VASECTOMY       Family History  Problem Relation Age  of Onset  . Dementia Mother        Multi-infarct dementia  . Stroke Mother   . Diabetes Father   . Cancer Paternal Grandfather        lung  . Colon cancer Neg Hx   . Pancreatic cancer Neg Hx   . Rectal cancer Neg Hx   . Stomach cancer Neg Hx   . Colon polyps Neg Hx      Social History   Socioeconomic History  . Marital status: Married    Spouse name: Not on file  . Number of children: 2  . Years of education: Not on file  . Highest education level: Not on file  Occupational History  .  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 Smoking status: Never Smoker  . Smokeless tobacco: Never Used  Substance and Sexual Activity  . Alcohol use: Yes    Alcohol/week: 10.0 standard drinks    Types: 10 Glasses of wine per week  . Drug use: No  . Sexual activity: Yes    Birth control/protection: Post-menopausal, Surgical  Other Topics Concern  . Not on file  Social History Narrative   Marital status:  Married x 8 years; third marriage; happily married.      Children: two children (28, 31); no grandchildren.  Several step grandchildren (9).        Lives: with wife. 1 dog.      Employment:  Retired 06/2015.  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        Tobacco: none      Alcohol: 2 glasses of wine 4x week.  Very rare intoxication.      Drugs: none      Exercise:  Walks daily for 20 minutes 1.3 miles per day; walks dog regularly.      Seatbelt: 100% of time      Guns in home: none      Sunscreen: yes; rare sun exposure.      ADLs: independent with ADLs. Drives      Advanced Directives:  YES; living will; FULL CODE; no prolonged measures.     Social Determinants of Health   Financial Resource Strain:   . Difficulty of Paying Living Expenses:   Food Insecurity:   . Worried About Charity fundraiser in the Last Year:   . Arboriculturist in the Last Year:   Transportation Needs:   . Film/video editor (Medical):   Marland Kitchen Lack of Transportation (Non-Medical):   Physical Activity:   . Days of Exercise per Week:   . Minutes of Exercise per Session:   Stress:   . Feeling of Stress :   Social Connections:   . Frequency of Communication with Friends and Family:   . Frequency of Social Gatherings with Friends and Family:   . Attends Religious Services:   . Active Member of Clubs or Organizations:   . Attends Archivist Meetings:   Marland Kitchen Marital Status:   Intimate Partner Violence:   . Fear of Current or  Ex-Partner:   . Emotionally Abused:   Marland Kitchen Physically Abused:   . Sexually Abused:      Allergies  Allergen Reactions  . Celebrex [Celecoxib] Hives    REACTION: Hives  . Flexeril [Cyclobenzaprine Hcl] Other (See Comments)    arrhythmias   . Nsaids Other (See Comments)    Unknown reaction  per pt  . Sulfa Drugs Cross Reactors Hives     Prior to Admission medications   Medication Sig Start Date End Date Taking? Authorizing Provider  citalopram (CELEXA) 40 MG tablet Take 1 tablet (40 mg total) by mouth daily. 06/20/19  Yes Rutherford Guys, MD  Multiple Vitamins-Minerals (MULTIVITAMIN WITH MINERALS) tablet Take 1 tablet by mouth daily.   Yes [provider]  OVER THE COUNTER MEDICATION Throat Coat Tea=HERBAL=cherry bark,licorice root,orange peel, elm bark, cinnamon bark, etc..   Yes [provider]  pseudoephedrine (SUDAFED) 30 MG tablet Take 30 mg by mouth daily.   Yes [provider]  hyoscyamine (LEVSIN) 0.125 MG tablet Take 1 tablet (0.125 mg total) by mouth every 4 (four) hours as needed. Patient not taking: Reported on 10/24/2019 06/20/19   Rutherford Guys, MD  Teprotumumab-trbw [facility] IV) Inject into the vein.    [provider]     Depression screen [facility] 2/9 10/24/2019 10/13/2018 09/08/2018 03/12/2018 03/12/2018  Decreased Interest 0 0 0 0 0  Down, Depressed, Hopeless 0 0 0 0 0  PHQ - 2 Score 0 0 0 0 0     Fall Risk  10/24/2019 06/20/2019 06/20/2019 10/13/2018 09/08/2018  Falls in the past year? 0 0 0 0 0  Number falls in past yr: 0 0 0 - 0  Injury with Fall? 0 0 0 - 0  Comment - - - - -  Risk for fall due to : - - - - Impaired vision  Follow up Falls evaluation completed;Education provided Falls evaluation completed Falls evaluation completed - -      PHYSICAL EXAM: BP 105/69 Comment: taken from a previous visit  Ht 5\' 11"  (1.803 m)   Wt 129 lb (58.5 kg)   BMI 17.99 kg/m    Wt Readings from Last 3 Encounters:  10/24/19 129 lb (58.5  kg)  02/23/19 129 lb (58.5 kg)  02/02/19 129 lb (58.5 kg)    Medicare annual wellness visit, subsequent    Education/Counseling provided regarding diet and exercise, prevention of chronic diseases, smoking/tobacco cessation, if applicable, and reviewed "Covered Medicare Preventive Services."

## 2019-10-24 NOTE — Patient Instructions (Addendum)
Thank you for taking time to come for your Medicare Wellness Visit. I appreciate your ongoing commitment to your health goals. Please review the following plan we discussed and let me know if I can assist you in the future.  Leroy Kennedy LPN  reventive Care 46 Years and Older, Male Preventive care refers to lifestyle choices and visits with your health care provider that can promote health and wellness. This includes:  A yearly physical exam. This is also called an annual well check.  Regular dental and eye exams.  Immunizations.  Screening for certain conditions.  Healthy lifestyle choices, such as diet and exercise. What can I expect for my preventive care visit? Physical exam Your health care provider will check:  Height and weight. These may be used to calculate body mass index (BMI), which is a measurement that tells if you are at a healthy weight.  Heart rate and blood pressure.  Your skin for abnormal spots. Counseling Your health care provider may ask you questions about:  Alcohol, tobacco, and drug use.  Emotional well-being.  Home and relationship well-being.  Sexual activity.  Eating habits.  History of falls.  Memory and ability to understand (cognition).  Work and work Statistician. What immunizations do I need?  Influenza (flu) vaccine  This is recommended every year. Tetanus, diphtheria, and pertussis (Tdap) vaccine  You may need a Td booster every 10 years. Varicella (chickenpox) vaccine  You may need this vaccine if you have not already been vaccinated. Zoster (shingles) vaccine  You may need this after age 46. Pneumococcal conjugate (PCV13) vaccine  One dose is recommended after age 59. Pneumococcal polysaccharide (PPSV23) vaccine  One dose is recommended after age 67. Measles, mumps, and rubella (MMR) vaccine  You may need at least one dose of MMR if you were born in 1957 or later. You may also need a second dose. Meningococcal  conjugate (MenACWY) vaccine  You may need this if you have certain conditions. Hepatitis A vaccine  You may need this if you have certain conditions or if you travel or work in places where you may be exposed to hepatitis A. Hepatitis B vaccine  You may need this if you have certain conditions or if you travel or work in places where you may be exposed to hepatitis B. Haemophilus influenzae type b (Hib) vaccine  You may need this if you have certain conditions. You may receive vaccines as individual doses or as more than one vaccine together in one shot (combination vaccines). Talk with your health care provider about the risks and benefits of combination vaccines. What tests do I need? Blood tests  Lipid and cholesterol levels. These may be checked every 5 years, or more frequently depending on your overall health.  Hepatitis C test.  Hepatitis B test. Screening  Lung cancer screening. You may have this screening every year starting at age 76 if you have a 30-pack-year history of smoking and currently smoke or have quit within the past 15 years.  Colorectal cancer screening. All adults should have this screening starting at age 43 and continuing until age 62. Your health care provider may recommend screening at age 8 if you are at increased risk. You will have tests every 1-10 years, depending on your results and the type of screening test.  Prostate cancer screening. Recommendations will vary depending on your family history and other risks.  Diabetes screening. This is done by checking your blood sugar (glucose) after you have not eaten for  a while (fasting). You may have this done every 1-3 years.  Abdominal aortic aneurysm (AAA) screening. You may need this if you are a current or former smoker.  Sexually transmitted disease (STD) testing. Follow these instructions at home: Eating and drinking  Eat a diet that includes fresh fruits and vegetables, whole grains, lean  protein, and low-fat dairy products. Limit your intake of foods with high amounts of sugar, saturated fats, and salt.  Take vitamin and mineral supplements as recommended by your health care provider.  Do not drink alcohol if your health care provider tells you not to drink.  If you drink alcohol: ? Limit how much you have to 0-2 drinks a day. ? Be aware of how much alcohol is in your drink. In the [place] one drink equals one 12 oz bottle of beer (355 mL), one 5 oz glass of wine (148 mL), or one 1 oz glass of hard liquor (44 mL). Lifestyle  Take daily care of your teeth and gums.  Stay active. Exercise for at least 30 minutes on 5 or more days each week.  Do not use any products that contain nicotine or tobacco, such as cigarettes, e-cigarettes, and chewing tobacco. If you need help quitting, ask your health care provider.  If you are sexually active, practice safe sex. Use a condom or other form of protection to prevent STIs (sexually transmitted infections).  Talk with your health care provider about taking a low-dose aspirin or statin. What's next?  Visit your health care provider once a year for a well check visit.  Ask your health care provider how often you should have your eyes and teeth checked.  Stay up to date on all vaccines. This information is not intended to replace advice given to you by your health care provider. Make sure you discuss any questions you have with your health care provider. Document Revised: 05/13/2018 Document Reviewed: 05/13/2018 Elsevier Patient Education  2020 [employer]  

## 2019-10-30 ENCOUNTER — Other Ambulatory Visit: Payer: Self-pay | Admitting: Family Medicine

## 2020-02-29 DIAGNOSIS — H905 Unspecified sensorineural hearing loss: Secondary | ICD-10-CM | POA: Diagnosis not present

## 2020-03-07 DIAGNOSIS — H1012 Acute atopic conjunctivitis, left eye: Secondary | ICD-10-CM | POA: Diagnosis not present

## 2020-06-05 ENCOUNTER — Encounter: Payer: Medicare Other | Admitting: Emergency Medicine

## 2020-08-01 ENCOUNTER — Ambulatory Visit (INDEPENDENT_AMBULATORY_CARE_PROVIDER_SITE_OTHER): Payer: Medicare Other | Admitting: Emergency Medicine

## 2020-08-01 ENCOUNTER — Encounter: Payer: Self-pay | Admitting: Emergency Medicine

## 2020-08-01 ENCOUNTER — Other Ambulatory Visit: Payer: Self-pay

## 2020-08-01 VITALS — BP 119/77 | HR 82 | Temp 98.3°F | Resp 16 | Ht 71.75 in | Wt 137.0 lb

## 2020-08-01 DIAGNOSIS — K580 Irritable bowel syndrome with diarrhea: Secondary | ICD-10-CM | POA: Diagnosis not present

## 2020-08-01 DIAGNOSIS — M5136 Other intervertebral disc degeneration, lumbar region: Secondary | ICD-10-CM

## 2020-08-01 DIAGNOSIS — E0500 Thyrotoxicosis with diffuse goiter without thyrotoxic crisis or storm: Secondary | ICD-10-CM | POA: Diagnosis not present

## 2020-08-01 DIAGNOSIS — K219 Gastro-esophageal reflux disease without esophagitis: Secondary | ICD-10-CM

## 2020-08-01 DIAGNOSIS — F32A Depression, unspecified: Secondary | ICD-10-CM | POA: Diagnosis not present

## 2020-08-01 DIAGNOSIS — D708 Other neutropenia: Secondary | ICD-10-CM | POA: Diagnosis not present

## 2020-08-01 DIAGNOSIS — Z7689 Persons encountering health services in other specified circumstances: Secondary | ICD-10-CM | POA: Diagnosis not present

## 2020-08-01 DIAGNOSIS — F419 Anxiety disorder, unspecified: Secondary | ICD-10-CM

## 2020-08-01 DIAGNOSIS — H5789 Other specified disorders of eye and adnexa: Secondary | ICD-10-CM

## 2020-08-01 NOTE — Patient Instructions (Addendum)
If you have lab work done today you will be contacted with your lab results within the next 2 weeks.  If you have not heard from [place] then please contact us. The fastest way to get your results is to register for My Chart.   IF you received an x-ray today, you will receive an invoice from [facility]. Please contact [facility] at [phone] with questions or concerns regarding your invoice.   IF you received labwork today, you will receive an invoice from [place]. Please contact [facility] at [phone] with questions or concerns regarding your invoice.   Our billing staff will not be able to assist you with questions regarding bills from these companies.  You will be contacted with the lab results as soon as they are available. The fastest way to get your results is to activate your My Chart account. Instructions are located on the last page of this paperwork. If you have not heard from [place] regarding the results in 2 weeks, please contact this office.     Discussed Health Maintenance After Age 38 After age 88, you are at a higher risk for certain long-term diseases and infections as well as injuries from falls. Falls are a major cause of broken bones and head injuries in people who are older than age 52. Getting regular preventive care can help to keep you healthy and well. Preventive care includes getting regular testing and making lifestyle changes as recommended by your health care provider. Talk with your health care provider about:  Which screenings and tests you should have. A screening is a test that checks for a disease when you have no symptoms.  A diet and exercise plan that is right for you. What should I know about screenings and tests to prevent falls? Screening and testing are the best ways to find a health problem early. Early diagnosis and treatment give you the best chance of managing medical conditions that are common after age 55. Certain conditions  and lifestyle choices may make you more likely to have a fall. Your health care provider may recommend:  Regular vision checks. Poor vision and conditions such as cataracts can make you more likely to have a fall. If you wear glasses, make sure to get your prescription updated if your vision changes.  Medicine review. Work with your health care provider to regularly review all of the medicines you are taking, including over-the-counter medicines. Ask your health care provider about any side effects that may make you more likely to have a fall. Tell your health care provider if any medicines that you take make you feel dizzy or sleepy.  Osteoporosis screening. Osteoporosis is a condition that causes the bones to get weaker. This can make the bones weak and cause them to break more easily.  Blood pressure screening. Blood pressure changes and medicines to control blood pressure can make you feel dizzy.  Strength and balance checks. Your health care provider may recommend certain tests to check your strength and balance while standing, walking, or changing positions.  Foot health exam. Foot pain and numbness, as well as not wearing proper footwear, can make you more likely to have a fall.  Depression screening. You may be more likely to have a fall if you have a fear of falling, feel emotionally low, or feel unable to do activities that you used to do.  Alcohol use screening. Using too much alcohol can affect your balance and may make you more likely  to have a fall. What actions can I take to lower my risk of falls? General instructions  Talk with your health care provider about your risks for falling. Tell your health care provider if: ? You fall. Be sure to tell your health care provider about all falls, even ones that seem minor. ? You feel dizzy, sleepy, or off-balance.  Take over-the-counter and prescription medicines only as told by your health care provider. These include any  supplements.  Eat a healthy diet and maintain a healthy weight. A healthy diet includes low-fat dairy products, low-fat (lean) meats, and fiber from whole grains, beans, and lots of fruits and vegetables. Home safety  Remove any tripping hazards, such as rugs, cords, and clutter.  Install safety equipment such as grab bars in bathrooms and safety rails on stairs.  Keep rooms and walkways well-lit. Activity  Follow a regular exercise program to stay fit. This will help you maintain your balance. Ask your health care provider what types of exercise are appropriate for you.  If you need a cane or walker, use it as recommended by your health care provider.  Wear supportive shoes that have nonskid soles.   Lifestyle  Do not drink alcohol if your health care provider tells you not to drink.  If you drink alcohol, limit how much you have: ? 0-1 drink a day for women. ? 0-2 drinks a day for men.  Be aware of how much alcohol is in your drink. In the [place] one drink equals one typical bottle of beer (12 oz), one-half glass of wine (5 oz), or one shot of hard liquor (1 oz).  Do not use any products that contain nicotine or tobacco, such as cigarettes and e-cigarettes. If you need help quitting, ask your health care provider. Summary  Having a healthy lifestyle and getting preventive care can help to protect your health and wellness after age 12.  Screening and testing are the best way to find a health problem early and help you avoid having a fall. Early diagnosis and treatment give you the best chance for managing medical conditions that are more common for people who are older than age 43.  Falls are a major cause of broken bones and head injuries in people who are older than age 38. Take precautions to prevent a fall at home.  Work with your health care provider to learn what changes you can make to improve your health and wellness and to prevent falls. This information is not intended  to replace advice given to you by your health care provider. Make sure you discuss any questions you have with your health care provider. Document Revised: 09/09/2018 Document Reviewed: 04/01/2017 Elsevier Patient Education  2021 [employer].

## 2020-08-01 NOTE — Progress Notes (Signed)
Paul Sparks. 70 y.o.   Chief Complaint  Patient presents with  . Transitions Of Care    Former Dr Ardyth Gal patient    HISTORY OF PRESENT ILLNESS: This is a 70 y.o. male former patient of Dr. Pamella Pert here to establish care with me. Healthy male with a healthy lifestyle. Vegetarian. Physically active. Colonoscopy done in 2020 showed 1 polyp and mild diverticulosis with internal hemorrhoids. Results reviewed with patient. Problem list reviewed with patient. Health self grade: B No other complaints or medical concerns today.  HPI   Prior to Admission medications   Medication Sig Start Date End Date Taking? Authorizing Provider  citalopram (CELEXA) 40 MG tablet TAKE 1 TABLET BY MOUTH  DAILY 10/30/19  Yes Jacelyn Pi, Lilia Argue, MD  Multiple Vitamins-Minerals (MULTIVITAMIN WITH MINERALS) tablet Take 1 tablet by mouth daily.   Yes [provider]  pseudoephedrine (SUDAFED) 30 MG tablet Take 30 mg by mouth daily.   Yes [provider]  hyoscyamine (LEVSIN) 0.125 MG tablet Take 1 tablet (0.125 mg total) by mouth every 4 (four) hours as needed. Patient not taking: Reported on 10/24/2019 06/20/19   Jacelyn Pi, Lilia Argue, MD  OVER THE COUNTER MEDICATION Throat Coat Tea=HERBAL=cherry bark,licorice root,orange peel, elm bark, cinnamon bark, etc..    [provider]  Teprotumumab-trbw (TEPEZZA IV) Inject into the vein.    [provider]    Allergies  Allergen Reactions  . Celebrex [Celecoxib] Hives    REACTION: Hives  . Flexeril [Cyclobenzaprine Hcl] Other (See Comments)    arrhythmias   . Nsaids Other (See Comments)    Unknown reaction per pt  . Sulfa Drugs Cross Reactors Hives    Patient Active Problem List   Diagnosis Date Noted  . Thyroid eye disease 10/13/2018  . Anxiety and depression 12/20/2017  . Leukocytopenia 04/20/2015  . Degenerative disc disease, lumbar 08/02/2012  . Degenerative disc disease, cervical 08/02/2012  . Folate  deficiency 04/27/2012  . Pure hypercholesterolemia 04/27/2012  . Irritable bowel syndrome with diarrhea 03/24/2012  . Skin lesion 03/24/2012  . Allergic rhinitis 03/24/2012  . Inguinal hernia 12/10/2010  . Prostate enlargement   . GERD 09/21/2008    Past Medical History:  Diagnosis Date  . Allergy   . Anxiety   . Arthritis   . BPPV (benign paroxysmal positional vertigo), unspecified laterality   . Colon polyp 11/24/2013   colonoscopy [name]. Repeat in 5 years.  . Dermatofibroma of back 12/11/10   [name].  . Eczema 12/11/10   [name].  . Genital herpes   . IBD (inflammatory bowel disease)    diarrhea, abdominal cramping.  . Inguinal hernia    Bilateral; s/p repair.  . Prostate enlargement    mildly and asymptomatic  . Seborrheic keratosis 12/11/10   [name]/Dermatology consult  . Thyroid disease   . Wears glasses     Past Surgical History:  Procedure Laterality Date  . COLONOSCOPY  11/24/2013   four polyp. [name], MD.  Repeat in 5 years.  Marland Kitchen HERNIA REPAIR N/A    [employer] 07/31/2020  . INGUINAL HERNIA REPAIR  2005   right  . KNEE ARTHROSCOPY    . POLYPECTOMY    . TONSILLECTOMY    . VASECTOMY      Social History   Socioeconomic History  . Marital status: Married    Spouse name: Not on file  . Number of children: 2  . Years of education: Not on file  . Highest education  level: Not on file  Occupational History  . 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 Smoking status: Never Smoker  . Smokeless tobacco: Never Used  Vaping Use  . Vaping Use: Never used  Substance and Sexual Activity  . Alcohol use: Yes    Alcohol/week: 10.0 standard drinks    Types: 10 Glasses of wine per week  . Drug use: No  . Sexual activity: Yes    Birth control/protection: Post-menopausal, Surgical  Other Topics Concern  . Not on file  Social History Narrative   Marital status:  Married x 8 years; third marriage;  happily married.      Children: two children (28, 31); no grandchildren.  Several step grandchildren (9).        Lives: with wife. 1 dog.      Employment:  Retired 06/2015.  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        Tobacco: none      Alcohol: 2 glasses of wine 4x week.  Very rare intoxication.      Drugs: none      Exercise:  Walks daily for 20 minutes 1.3 miles per day; walks dog regularly.      Seatbelt: 100% of time      Guns in home: none      Sunscreen: yes; rare sun exposure.      ADLs: independent with ADLs. Drives      Advanced Directives:  YES; living will; FULL CODE; no prolonged measures.     Social Determinants of Health   Financial Resource Strain: Not on file  Food Insecurity: Not on file  Transportation Needs: Not on file  Physical Activity: Not on file  Stress: Not on file  Social Connections: Not on file  Intimate Partner Violence: Not on file    Family History  Problem Relation Age of Onset  . Dementia Mother        Multi-infarct dementia  . Stroke Mother   . Diabetes Father   . Cancer Paternal Grandfather        lung  . Colon cancer Neg Hx   . Pancreatic cancer Neg Hx   . Rectal cancer Neg Hx   . Stomach cancer Neg Hx   . Colon polyps Neg Hx      Review of Systems  Constitutional: Negative.  Negative for chills and fever.  HENT: Negative.  Negative for congestion and sore throat.   Respiratory: Negative.  Negative for cough and shortness of breath.   Cardiovascular: Negative.  Negative for chest pain and palpitations.  Gastrointestinal: Negative.  Negative for abdominal pain, blood in stool, diarrhea, melena, nausea and vomiting.  Genitourinary: Negative.  Negative for dysuria and hematuria.  Musculoskeletal: Positive for back pain and joint pain.  Skin: Negative.  Negative for rash.  Neurological: Negative.  Negative for dizziness and headaches.  All other systems reviewed and are negative.   Today's  Vitals   08/01/20 1316  BP: 119/77  Pulse: 82  Resp: 16  Temp: 98.3 F (36.8 C)  TempSrc: Temporal  SpO2: 97%  Weight: 137 lb (62.1 kg)  Height: 5' 11.75" (1.822 m)   Body mass index is 18.71 kg/m.  Physical Exam Vitals reviewed.  Constitutional:      Appearance: Normal appearance.  HENT:     Head: Normocephalic.  Eyes:     Extraocular Movements: Extraocular movements intact.  Conjunctiva/sclera: Conjunctivae normal.     Pupils: Pupils are equal, round, and reactive to light.  Cardiovascular:     Rate and Rhythm: Normal rate.     Pulses: Normal pulses.     Heart sounds: Normal heart sounds.  Pulmonary:     Effort: Pulmonary effort is normal.     Breath sounds: Normal breath sounds.  Abdominal:     Palpations: Abdomen is soft.     Tenderness: There is no abdominal tenderness.  Musculoskeletal:        General: Normal range of motion.     Cervical back: Normal range of motion and neck supple.  Skin:    General: Skin is warm and dry.     Capillary Refill: Capillary refill takes less than 2 seconds.  Neurological:     General: No focal deficit present.     Mental Status: He is alert and oriented to person, place, and time.  Psychiatric:        Mood and Affect: Mood normal.        Behavior: Behavior normal.      ASSESSMENT & PLAN: Paul Sparks was seen today for transitions of care.  Diagnoses and all orders for this visit:  Encounter to establish care  Other neutropenia ([place])  Irritable bowel syndrome with diarrhea  Degenerative disc disease, lumbar  Thyroid eye disease  Gastroesophageal reflux disease without esophagitis  Anxiety and depression    Patient Instructions       If you have lab work done today you will be contacted with your lab results within the next 2 weeks.  If you have not heard from [place] then please contact us. The fastest way to get your results is to register for My Chart.   IF you received an x-ray today, you will receive an  invoice from [facility]. Please contact [facility] at [phone] with questions or concerns regarding your invoice.   IF you received labwork today, you will receive an invoice from [place]. Please contact [facility] at [phone] with questions or concerns regarding your invoice.   Our billing staff will not be able to assist you with questions regarding bills from these companies.  You will be contacted with the lab results as soon as they are available. The fastest way to get your results is to activate your My Chart account. Instructions are located on the last page of this paperwork. If you have not heard from [place] regarding the results in 2 weeks, please contact this office.     Discussed Health Maintenance After Age 6 After age 29, you are at a higher risk for certain long-term diseases and infections as well as injuries from falls. Falls are a major cause of broken bones and head injuries in people who are older than age 47. Getting regular preventive care can help to keep you healthy and well. Preventive care includes getting regular testing and making lifestyle changes as recommended by your health care provider. Talk with your health care provider about:  Which screenings and tests you should have. A screening is a test that checks for a disease when you have no symptoms.  A diet and exercise plan that is right for you. What should I know about screenings and tests to prevent falls? Screening and testing are the best ways to find a health problem early. Early diagnosis and treatment give you the best chance of managing medical conditions that are common after age 70. Certain conditions and lifestyle choices may make  you more likely to have a fall. Your health care provider may recommend:  Regular vision checks. Poor vision and conditions such as cataracts can make you more likely to have a fall. If you wear glasses, make sure to get your prescription updated if  your vision changes.  Medicine review. Work with your health care provider to regularly review all of the medicines you are taking, including over-the-counter medicines. Ask your health care provider about any side effects that may make you more likely to have a fall. Tell your health care provider if any medicines that you take make you feel dizzy or sleepy.  Osteoporosis screening. Osteoporosis is a condition that causes the bones to get weaker. This can make the bones weak and cause them to break more easily.  Blood pressure screening. Blood pressure changes and medicines to control blood pressure can make you feel dizzy.  Strength and balance checks. Your health care provider may recommend certain tests to check your strength and balance while standing, walking, or changing positions.  Foot health exam. Foot pain and numbness, as well as not wearing proper footwear, can make you more likely to have a fall.  Depression screening. You may be more likely to have a fall if you have a fear of falling, feel emotionally low, or feel unable to do activities that you used to do.  Alcohol use screening. Using too much alcohol can affect your balance and may make you more likely to have a fall. What actions can I take to lower my risk of falls? General instructions  Talk with your health care provider about your risks for falling. Tell your health care provider if: ? You fall. Be sure to tell your health care provider about all falls, even ones that seem minor. ? You feel dizzy, sleepy, or off-balance.  Take over-the-counter and prescription medicines only as told by your health care provider. These include any supplements.  Eat a healthy diet and maintain a healthy weight. A healthy diet includes low-fat dairy products, low-fat (lean) meats, and fiber from whole grains, beans, and lots of fruits and vegetables. Home safety  Remove any tripping hazards, such as rugs, cords, and  clutter.  Install safety equipment such as grab bars in bathrooms and safety rails on stairs.  Keep rooms and walkways well-lit. Activity  Follow a regular exercise program to stay fit. This will help you maintain your balance. Ask your health care provider what types of exercise are appropriate for you.  If you need a cane or walker, use it as recommended by your health care provider.  Wear supportive shoes that have nonskid soles.   Lifestyle  Do not drink alcohol if your health care provider tells you not to drink.  If you drink alcohol, limit how much you have: ? 0-1 drink a day for women. ? 0-2 drinks a day for men.  Be aware of how much alcohol is in your drink. In the [place] one drink equals one typical bottle of beer (12 oz), one-half glass of wine (5 oz), or one shot of hard liquor (1 oz).  Do not use any products that contain nicotine or tobacco, such as cigarettes and e-cigarettes. If you need help quitting, ask your health care provider. Summary  Having a healthy lifestyle and getting preventive care can help to protect your health and wellness after age 53.  Screening and testing are the best way to find a health problem early and help you avoid having a  fall. Early diagnosis and treatment give you the best chance for managing medical conditions that are more common for people who are older than age 72.  Falls are a major cause of broken bones and head injuries in people who are older than age 86. Take precautions to prevent a fall at home.  Work with your health care provider to learn what changes you can make to improve your health and wellness and to prevent falls. This information is not intended to replace advice given to you by your health care provider. Make sure you discuss any questions you have with your health care provider. Document Revised: 09/09/2018 Document Reviewed: 04/01/2017 Elsevier Patient Education  2021 [employer]      Agustina Caroli,  MD [facility]

## 2020-12-20 ENCOUNTER — Other Ambulatory Visit: Payer: Self-pay

## 2020-12-20 ENCOUNTER — Encounter: Payer: Self-pay | Admitting: Emergency Medicine

## 2020-12-20 ENCOUNTER — Ambulatory Visit (INDEPENDENT_AMBULATORY_CARE_PROVIDER_SITE_OTHER): Payer: Medicare Other | Admitting: Emergency Medicine

## 2020-12-20 VITALS — BP 122/68 | HR 80 | Temp 98.6°F | Ht 71.0 in | Wt 141.0 lb

## 2020-12-20 DIAGNOSIS — Z13220 Encounter for screening for lipoid disorders: Secondary | ICD-10-CM

## 2020-12-20 DIAGNOSIS — Z8639 Personal history of other endocrine, nutritional and metabolic disease: Secondary | ICD-10-CM | POA: Diagnosis not present

## 2020-12-20 DIAGNOSIS — Z13228 Encounter for screening for other metabolic disorders: Secondary | ICD-10-CM

## 2020-12-20 DIAGNOSIS — Z0000 Encounter for general adult medical examination without abnormal findings: Secondary | ICD-10-CM

## 2020-12-20 DIAGNOSIS — Z125 Encounter for screening for malignant neoplasm of prostate: Secondary | ICD-10-CM | POA: Diagnosis not present

## 2020-12-20 DIAGNOSIS — Z1329 Encounter for screening for other suspected endocrine disorder: Secondary | ICD-10-CM | POA: Diagnosis not present

## 2020-12-20 DIAGNOSIS — Z130 Encounter for screening for diseases of the blood and blood-forming organs and certain disorders involving the immune mechanism: Secondary | ICD-10-CM

## 2020-12-20 LAB — LIPID PANEL
Cholesterol: 224 mg/dL — ABNORMAL HIGH (ref 0–200)
HDL: 58.9 mg/dL (ref >39.00)
LDL Cholesterol: 144 mg/dL — ABNORMAL HIGH (ref 0–99)
NonHDL: 165.52
Total CHOL/HDL Ratio: 4
Triglycerides: 106 mg/dL (ref 0.0–149.0)
VLDL: 21.2 mg/dL (ref 0.0–40.0)

## 2020-12-20 LAB — CBC WITH DIFFERENTIAL/PLATELET
Basophils Absolute: 0 10*3/uL (ref 0.0–0.1)
Basophils Relative: 0.2 % (ref 0.0–3.0)
Eosinophils Absolute: 0.1 10*3/uL (ref 0.0–0.7)
Eosinophils Relative: 1.9 % (ref 0.0–5.0)
HCT: 39.6 % (ref 39.0–52.0)
Hemoglobin: 13.6 g/dL (ref 13.0–17.0)
Lymphocytes Relative: 24.1 % (ref 12.0–46.0)
Lymphs Abs: 1.1 10*3/uL (ref 0.7–4.0)
MCHC: 34.3 g/dL (ref 30.0–36.0)
MCV: 96.4 fl (ref 78.0–100.0)
Monocytes Absolute: 0.3 10*3/uL (ref 0.1–1.0)
Monocytes Relative: 7 % (ref 3.0–12.0)
Neutro Abs: 3 10*3/uL (ref 1.4–7.7)
Neutrophils Relative %: 66.8 % (ref 43.0–77.0)
Platelets: 148 10*3/uL — ABNORMAL LOW (ref 150.0–400.0)
RBC: 4.11 Mil/uL — ABNORMAL LOW (ref 4.22–5.81)
RDW: 12.4 % (ref 11.5–15.5)
WBC: 4.6 10*3/uL (ref 4.0–10.5)

## 2020-12-20 LAB — COMPREHENSIVE METABOLIC PANEL
ALT: 16 U/L (ref 0–53)
AST: 23 U/L (ref 0–37)
Albumin: 4.5 g/dL (ref 3.5–5.2)
Alkaline Phosphatase: 83 U/L (ref 39–117)
BUN: 14 mg/dL (ref 6–23)
CO2: 31 mEq/L (ref 19–32)
Calcium: 9.7 mg/dL (ref 8.4–10.5)
Chloride: 102 mEq/L (ref 96–112)
Creatinine, Ser: 0.8 mg/dL (ref 0.40–1.50)
GFR: 88.66 mL/min (ref >60.00)
Glucose, Bld: 91 mg/dL (ref 70–99)
Potassium: 4.2 mEq/L (ref 3.5–5.1)
Sodium: 139 mEq/L (ref 135–145)
Total Bilirubin: 0.6 mg/dL (ref 0.2–1.2)
Total Protein: 6.6 g/dL (ref 6.0–8.3)

## 2020-12-20 LAB — HEMOGLOBIN A1C: Hgb A1c MFr Bld: 5.4 % (ref 4.6–6.5)

## 2020-12-20 LAB — PSA: PSA: 2.4 ng/mL (ref 0.10–4.00)

## 2020-12-20 NOTE — Progress Notes (Signed)
Paul Sparks. 70 y.o.   Chief Complaint  Patient presents with   Follow-up    Pt states that he has Graves disease and would like to get lab work. Pt would like physcial    HISTORY OF PRESENT ILLNESS: This is a 70 y.o. male here for annual physical blood work. Patient has history of Graves' disease.  Requesting blood work. Healthy male with a healthy lifestyle.  Non-smoker.  Vegetarian. No other complaints or medical concerns.  HPI   Prior to Admission medications   Medication Sig Start Date End Date Taking? Authorizing Provider  citalopram (CELEXA) 40 MG tablet TAKE 1 TABLET BY MOUTH  DAILY 10/30/19   Jacelyn Pi, Lilia Argue, MD  Multiple Vitamins-Minerals (MULTIVITAMIN WITH MINERALS) tablet Take 1 tablet by mouth daily.    [provider]  pseudoephedrine (SUDAFED) 30 MG tablet Take 30 mg by mouth daily.    [provider]    Allergies  Allergen Reactions   Celebrex [Celecoxib] Hives    REACTION: Hives   Flexeril [Cyclobenzaprine Hcl] Other (See Comments)    arrhythmias    Nsaids Other (See Comments)    Unknown reaction per pt   Sulfa Drugs Cross Reactors Hives    Patient Active Problem List   Diagnosis Date Noted   Thyroid eye disease 10/13/2018   Anxiety and depression 12/20/2017   Other neutropenia ([place]) 04/20/2015   Degenerative disc disease, lumbar 08/02/2012   Degenerative disc disease, cervical 08/02/2012   Folate deficiency 04/27/2012   Pure hypercholesterolemia 04/27/2012   Irritable bowel syndrome with diarrhea 03/24/2012   Allergic rhinitis 03/24/2012   Inguinal hernia 12/10/2010   Prostate enlargement    GERD 09/21/2008    Past Medical History:  Diagnosis Date   Allergy    Anxiety    Arthritis    BPPV (benign paroxysmal positional vertigo), unspecified laterality    Colon polyp 11/24/2013   colonoscopy [name]. Repeat in 5 years.   Dermatofibroma of back 12/11/10   [name].   Eczema 12/11/10   Dermatology  consult/[name].   Genital herpes    IBD (inflammatory bowel disease)    diarrhea, abdominal cramping.   Inguinal hernia    Bilateral; s/p repair.   Prostate enlargement    mildly and asymptomatic   Seborrheic keratosis 12/11/10   [name]/Dermatology consult   Thyroid disease    Wears glasses     Past Surgical History:  Procedure Laterality Date   COLONOSCOPY  11/24/2013   four polyp. [name], MD.  Repeat in 5 years.   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      Children: two children (28, 31); no grandchildren.  Several step grandchildren (9).        Lives: with wife. 1 dog.      Employment:  Retired 06/2015.  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        Tobacco: none      Alcohol: 2 glasses of wine 4x week.  Very rare intoxication.      Drugs: none      Exercise:  Walks daily for 20 minutes 1.3 miles per day; walks dog regularly.      Seatbelt: 100% of time      Guns in home: none      Sunscreen: yes; rare sun exposure.      ADLs: independent with ADLs. Drives      Advanced Directives:  YES;  living will; FULL CODE; no prolonged measures.     Social Determinants of Health   Financial Resource Strain: Not on file  Food Insecurity: Not on file  Transportation Needs: Not on file  Physical Activity: Not on file  Stress: Not on file  Social Connections: Not on file  Intimate Partner Violence: Not on file    Family History  Problem Relation Age of Onset   Dementia Mother        Multi-infarct dementia   Stroke Mother    Diabetes Father    Cancer Paternal Grandfather        lung   Colon cancer Neg Hx    Pancreatic cancer Neg Hx    Rectal cancer Neg Hx    Stomach cancer Neg Hx    Colon polyps Neg Hx      Review of Systems  Constitutional: Negative.  Negative for chills and fever.  HENT: Negative.  Negative for congestion and sore throat.   Respiratory: Negative.  Negative for cough and shortness of breath.   Cardiovascular: Negative.  Negative for chest pain and palpitations.  Gastrointestinal:  Negative for abdominal pain, blood in stool, melena, nausea and vomiting.  Genitourinary: Negative.  Negative for dysuria and hematuria.  Musculoskeletal:  Positive for joint pain. Negative for back pain, myalgias and neck pain.  Skin: Negative.  Negative for rash.  Neurological:  Negative for dizziness and headaches.  All other systems reviewed and are negative.  Today's Vitals   12/20/20 1457  BP: 122/68  Pulse: 80  Temp: 98.6 F (37 C)  TempSrc: Oral  SpO2: 97%  Weight: 141 lb (64 kg)  Height: 5\' 11"  (1.803 m)   Body mass index is 19.67 kg/m. Wt Readings from Last 3 Encounters:  12/20/20 141 lb (64 kg)  08/01/20 137 lb (62.1 kg)  10/24/19 129 lb (58.5 kg)    Physical Exam Vitals reviewed.  Constitutional:      Appearance: Normal appearance.  HENT:     Head: Normocephalic.     Right Ear: Tympanic membrane, ear canal and external ear normal.     Left Ear: Tympanic membrane, ear canal and external ear normal.  Eyes:     Extraocular Movements: Extraocular  movements intact.     Conjunctiva/sclera: Conjunctivae normal.     Pupils: Pupils are equal, round, and reactive to light.  Neck:     Vascular: No carotid bruit.  Cardiovascular:  Rate and Rhythm: Normal rate and regular rhythm.     Pulses: Normal pulses.     Heart sounds: Normal heart sounds.  Pulmonary:     Effort: Pulmonary effort is normal.     Breath sounds: Normal breath sounds.  Abdominal:     General: Bowel sounds are normal. There is no distension.     Palpations: Abdomen is soft. There is no mass.     Tenderness: There is no abdominal tenderness.  Musculoskeletal:        General: Normal range of motion.     Cervical back: Normal range of motion and neck supple. No tenderness.     Right lower leg: No edema.     Left lower leg: No edema.  Lymphadenopathy:     Cervical: No cervical adenopathy.  Skin:    General: Skin is warm and dry.     Capillary Refill: Capillary refill takes less than 2 seconds.  Neurological:     General: No focal deficit present.     Mental Status: He is alert and oriented to person, place, and time.  Psychiatric:        Mood and Affect: Mood normal.        Behavior: Behavior normal.     ASSESSMENT & PLAN: Jarold was seen today for follow-up.  Diagnoses and all orders for this visit:  Routine general medical examination at a health care facility  Screening for deficiency anemia -     CBC with Differential/Platelet  Screening for lipoid disorders -     Lipid panel  Screening for endocrine, metabolic and immunity disorder -     Comprehensive metabolic panel -     Hemoglobin A1c  History of Graves' disease -     Thyroid Panel With TSH  Prostate cancer screening -     PSA  Modifiable risk factors discussed with patient. Anticipatory guidance according to age provided. The following topics were also discussed: Thyroid disease and diagnosis of Graves' disease Social Determinants of Health Smoking advice not required Diet and  nutrition Benefits of exercise Cancer screening and review of last colonoscopy  Vaccinations requirements Cardiovascular risk assessment Mental health including depression and anxiety Fall and accident prevention  Patient Instructions  Health Maintenance, Male Adopting a healthy lifestyle and getting preventive care are important in promoting health and wellness. Ask your health care provider about: The right schedule for you to have regular tests and exams. Things you can do on your own to prevent diseases and keep yourself healthy. What should I know about diet, weight, and exercise? Eat a healthy diet  Eat a diet that includes plenty of vegetables, fruits, low-fat dairy products, and lean protein. Do not eat a lot of foods that are high in solid fats, added sugars, or sodium.  Maintain a healthy weight Body mass index (BMI) is a measurement that can be used to identify possible weight problems. It estimates body fat based on height and weight. Your health care provider can help determine your BMI and help you achieve or maintain ahealthy weight. Get regular exercise Get regular exercise. This is one of the most important things you can do for your health. Most adults should: Exercise for at least 150 minutes each week. The exercise should increase your heart rate and make you sweat (moderate-intensity exercise). Do strengthening exercises at least twice a week. This is in addition to the moderate-intensity exercise. Spend less time sitting. Even light physical activity can be beneficial. Watch cholesterol and  blood lipids Have your blood tested for lipids and cholesterol at 71 years of age, then havethis test every 5 years. You may need to have your cholesterol levels checked more often if: Your lipid or cholesterol levels are high. You are older than 72 years of age. You are at high risk for heart disease. What should I know about cancer screening? Many types of cancers can be  detected early and may often be prevented. Depending on your health history and family history, you may need to have cancer screening at various ages. This may include screening for: Colorectal cancer. Prostate cancer. Skin cancer. Lung cancer. What should I know about heart disease, diabetes, and high blood pressure? Blood pressure and heart disease High blood pressure causes heart disease and increases the risk of stroke. This is more likely to develop in people who have high blood pressure readings, are of African descent, or are overweight. Talk with your health care provider about your target blood pressure readings. Have your blood pressure checked: Every 3-5 years if you are 37-38 years of age. Every year if you are 24 years old or older. If you are between the ages of 44 and 17 and are a current or former smoker, ask your health care provider if you should have a one-time screening for abdominal aortic aneurysm (AAA). Diabetes Have regular diabetes screenings. This checks your fasting blood sugar level. Have the screening done: Once every three years after age 43 if you are at a normal weight and have a low risk for diabetes. More often and at a younger age if you are overweight or have a high risk for diabetes. What should I know about preventing infection? Hepatitis B If you have a higher risk for hepatitis B, you should be screened for this virus. Talk with your health care provider to find out if you are at risk forhepatitis B infection. Hepatitis C Blood testing is recommended for: Everyone born from [birth year] through 1967. Anyone with known risk factors for hepatitis C. Sexually transmitted infections (STIs) You should be screened each year for STIs, including gonorrhea and chlamydia, if: You are sexually active and are younger than 72 years of age. You are older than 72 years of age and your health care provider tells you that you are at risk for this type of infection. Your  sexual activity has changed since you were last screened, and you are at increased risk for chlamydia or gonorrhea. Ask your health care provider if you are at risk. Ask your health care provider about whether you are at high risk for HIV. Your health care provider may recommend a prescription medicine to help prevent HIV infection. If you choose to take medicine to prevent HIV, you should first get tested for HIV. You should then be tested every 3 months for as long as you are taking the medicine. Follow these instructions at home: Lifestyle Do not use any products that contain nicotine or tobacco, such as cigarettes, e-cigarettes, and chewing tobacco. If you need help quitting, ask your health care provider. Do not use street drugs. Do not share needles. Ask your health care provider for help if you need support or information about quitting drugs. Alcohol use Do not drink alcohol if your health care provider tells you not to drink. If you drink alcohol: Limit how much you have to 0-2 drinks a day. Be aware of how much alcohol is in your drink. In the [place] one drink equals one 12 oz  bottle of beer (355 mL), one 5 oz glass of wine (148 mL), or one 1 oz glass of hard liquor (44 mL). General instructions Schedule regular health, dental, and eye exams. Stay current with your vaccines. Tell your health care provider if: You often feel depressed. You have ever been abused or do not feel safe at home. Summary Adopting a healthy lifestyle and getting preventive care are important in promoting health and wellness. Follow your health care provider's instructions about healthy diet, exercising, and getting tested or screened for diseases. Follow your health care provider's instructions on monitoring your cholesterol and blood pressure. This information is not intended to replace advice given to you by your health care provider. Make sure you discuss any questions you have with your healthcare  provider. Document Revised: 05/12/2018 Document Reviewed: 05/12/2018 Elsevier Patient Education  2022 [place], MD [facility]

## 2020-12-20 NOTE — Patient Instructions (Signed)
Health Maintenance, Male Adopting a healthy lifestyle and getting preventive care are important in promoting health and wellness. Ask your health care provider about: The right schedule for you to have regular tests and exams. Things you can do on your own to prevent diseases and keep yourself healthy. What should I know about diet, weight, and exercise? Eat a healthy diet  Eat a diet that includes plenty of vegetables, fruits, low-fat dairy products, and lean protein. Do not eat a lot of foods that are high in solid fats, added sugars, or sodium.  Maintain a healthy weight Body mass index (BMI) is a measurement that can be used to identify possible weight problems. It estimates body fat based on height and weight. Your health care provider can help determine your BMI and help you achieve or maintain ahealthy weight. Get regular exercise Get regular exercise. This is one of the most important things you can do for your health. Most adults should: Exercise for at least 150 minutes each week. The exercise should increase your heart rate and make you sweat (moderate-intensity exercise). Do strengthening exercises at least twice a week. This is in addition to the moderate-intensity exercise. Spend less time sitting. Even light physical activity can be beneficial. Watch cholesterol and blood lipids Have your blood tested for lipids and cholesterol at 72 years of age, then havethis test every 5 years. You may need to have your cholesterol levels checked more often if: Your lipid or cholesterol levels are high. You are older than 72 years of age. You are at high risk for heart disease. What should I know about cancer screening? Many types of cancers can be detected early and may often be prevented. Depending on your health history and family history, you may need to have cancer screening at various ages. This may include screening for: Colorectal cancer. Prostate cancer. Skin cancer. Lung  cancer. What should I know about heart disease, diabetes, and high blood pressure? Blood pressure and heart disease High blood pressure causes heart disease and increases the risk of stroke. This is more likely to develop in people who have high blood pressure readings, are of African descent, or are overweight. Talk with your health care provider about your target blood pressure readings. Have your blood pressure checked: Every 3-5 years if you are 18-39 years of age. Every year if you are 40 years old or older. If you are between the ages of 65 and 75 and are a current or former smoker, ask your health care provider if you should have a one-time screening for abdominal aortic aneurysm (AAA). Diabetes Have regular diabetes screenings. This checks your fasting blood sugar level. Have the screening done: Once every three years after age 45 if you are at a normal weight and have a low risk for diabetes. More often and at a younger age if you are overweight or have a high risk for diabetes. What should I know about preventing infection? Hepatitis B If you have a higher risk for hepatitis B, you should be screened for this virus. Talk with your health care provider to find out if you are at risk forhepatitis B infection. Hepatitis C Blood testing is recommended for: Everyone born from 1945 through 1965. Anyone with known risk factors for hepatitis C. Sexually transmitted infections (STIs) You should be screened each year for STIs, including gonorrhea and chlamydia, if: You are sexually active and are younger than 72 years of age. You are older than 72 years of age   and your health care provider tells you that you are at risk for this type of infection. Your sexual activity has changed since you were last screened, and you are at increased risk for chlamydia or gonorrhea. Ask your health care provider if you are at risk. Ask your health care provider about whether you are at high risk for HIV.  Your health care provider may recommend a prescription medicine to help prevent HIV infection. If you choose to take medicine to prevent HIV, you should first get tested for HIV. You should then be tested every 3 months for as long as you are taking the medicine. Follow these instructions at home: Lifestyle Do not use any products that contain nicotine or tobacco, such as cigarettes, e-cigarettes, and chewing tobacco. If you need help quitting, ask your health care provider. Do not use street drugs. Do not share needles. Ask your health care provider for help if you need support or information about quitting drugs. Alcohol use Do not drink alcohol if your health care provider tells you not to drink. If you drink alcohol: Limit how much you have to 0-2 drinks a day. Be aware of how much alcohol is in your drink. In the [place] one drink equals one 12 oz bottle of beer (355 mL), one 5 oz glass of wine (148 mL), or one 1 oz glass of hard liquor (44 mL). General instructions Schedule regular health, dental, and eye exams. Stay current with your vaccines. Tell your health care provider if: You often feel depressed. You have ever been abused or do not feel safe at home. Summary Adopting a healthy lifestyle and getting preventive care are important in promoting health and wellness. Follow your health care provider's instructions about healthy diet, exercising, and getting tested or screened for diseases. Follow your health care provider's instructions on monitoring your cholesterol and blood pressure. This information is not intended to replace advice given to you by your health care provider. Make sure you discuss any questions you have with your healthcare provider. Document Revised: 05/12/2018 Document Reviewed: 05/12/2018 Elsevier Patient Education  2022 Elsevier Inc[employer]  

## 2020-12-21 LAB — THYROID PANEL WITH TSH
Free Thyroxine Index: 2 (ref 1.4–3.8)
T3 Uptake: 30 % (ref 22–35)
T4, Total: 6.5 ug/dL (ref 4.9–10.5)
TSH: 1.57 mIU/L (ref 0.40–4.50)

## 2021-01-07 ENCOUNTER — Encounter: Payer: Self-pay | Admitting: Emergency Medicine

## 2021-01-07 NOTE — Telephone Encounter (Signed)
Acknowledged. Thanks

## 2021-01-21 ENCOUNTER — Other Ambulatory Visit: Payer: Self-pay | Admitting: Emergency Medicine

## 2021-01-21 NOTE — Telephone Encounter (Signed)
   Patient requesting refill for citalopram (CELEXA) 40 MG tablet  Pharmacy [id number] [place], [place]

## 2021-01-24 NOTE — Telephone Encounter (Signed)
Patient calling in to check status of refill req for citalopram (CELEXA) 40 MG tablet   Please see previous phone message

## 2021-01-25 NOTE — Telephone Encounter (Signed)
Sent prescription to provider for approval.

## 2021-01-25 NOTE — Telephone Encounter (Signed)
Patient is requesting a refill of the following medications: Requested Prescriptions   Pending Prescriptions Disp Refills   citalopram (CELEXA) 40 MG tablet 90 tablet 3    Sig: Take 1 tablet (40 mg total) by mouth daily.    Date of patient request: 01/21/21  Last office visit: 12/20/20  Date of last refill: 10/30/19  Last refill amount: 90,3 Follow up time period per chart: n/a

## 2021-01-26 MED ORDER — CITALOPRAM HYDROBROMIDE 40 MG PO TABS: 40.0000 mg | ORAL_TABLET | Freq: Every day | ORAL | 3 refills | Status: DC

## 2021-02-19 ENCOUNTER — Telehealth: Payer: Self-pay | Admitting: Emergency Medicine

## 2021-02-19 NOTE — Telephone Encounter (Signed)
LVM for pt to rtn my call to schedule awv with nha . Please schedule if pt calls the office.

## 2021-02-28 ENCOUNTER — Ambulatory Visit (INDEPENDENT_AMBULATORY_CARE_PROVIDER_SITE_OTHER): Payer: Medicare Other

## 2021-02-28 DIAGNOSIS — Z0000 Encounter for general adult medical examination without abnormal findings: Secondary | ICD-10-CM | POA: Diagnosis not present

## 2021-02-28 NOTE — Progress Notes (Signed)
I connected with Paul Sparks today by telephone and verified that I am speaking with the correct person using two identifiers. Location patient: home Location provider: work Persons participating in the virtual visit: patient, provider.   I discussed the limitations, risks, security and privacy concerns of performing an evaluation and management service by telephone and the availability of in person appointments. I also discussed with the patient that there may be a patient responsible charge related to this service. The patient expressed understanding and verbally consented to this telephonic visit.    Interactive audio and video telecommunications were attempted between this provider and patient, however failed, due to patient having technical difficulties OR patient did not have access to video capability.  We continued and completed visit with audio only.  Some vital signs may be absent or patient reported.   Time Spent with patient on telephone encounter: 40 minutes  Subjective:   Paul Sparks. is a 70 y.o. male who presents for Medicare Annual/Subsequent preventive examination.  Review of Systems     Cardiac Risk Factors include: advanced age (>77men, >22 women);family history of premature cardiovascular disease;male gender     Objective:    There were no vitals filed for this visit. There is no height or weight on file to calculate BMI.  Advanced Directives 02/28/2021 10/24/2019 09/08/2018 04/29/2016 11/24/2013 11/10/2013  Does Patient Have a Medical Advance Directive? Yes Yes Yes No Patient does not have advance directive Patient does not have advance directive  Type of Advance Directive Living will;Healthcare Power of [place];Living will - - -  Does patient want to make changes to medical advance directive? No - Patient declined No - Patient declined - - - -  Copy of [place] in Chart? No - copy  requested No - copy requested - - - -  Pre-existing out of facility DNR order (yellow form or pink MOST form) - - - - - No    Current Medications (verified) Outpatient Encounter Medications as of 02/28/2021  Medication Sig   citalopram (CELEXA) 40 MG tablet Take 1 tablet (40 mg total) by mouth daily.   Multiple Vitamins-Minerals (MULTIVITAMIN WITH MINERALS) tablet Take 1 tablet by mouth daily.   Probiotic Product (PROBIOTIC DAILY PO) Take by mouth.   pseudoephedrine (SUDAFED) 30 MG tablet Take 30 mg by mouth daily.   No facility-administered encounter medications on file as of 02/28/2021.    Allergies (verified) Celebrex [celecoxib], Flexeril [cyclobenzaprine hcl], Nsaids, and Sulfa drugs cross reactors   History: Past Medical History:  Diagnosis Date   Allergy    Anxiety    Arthritis    BPPV (benign paroxysmal positional vertigo), unspecified laterality    Colon polyp 11/24/2013   colonoscopy [name]. Repeat in 5 years.   Dermatofibroma of back 12/11/10   [name].   Eczema 12/11/10   [name].   Genital herpes    IBD (inflammatory bowel disease)    diarrhea, abdominal cramping.   Inguinal hernia    Bilateral; s/p repair.   Prostate enlargement    mildly and asymptomatic   Seborrheic keratosis 12/11/10   [name]/Dermatology consult   Thyroid disease    Wears glasses    Past Surgical History:  Procedure Laterality Date   COLONOSCOPY  11/24/2013   four polyp. [name], MD.  Repeat in 5 years.   HERNIA REPAIR N/A    [employer] 07/31/2020   INGUINAL HERNIA REPAIR  2005  right   KNEE ARTHROSCOPY     POLYPECTOMY     TONSILLECTOMY     VASECTOMY     Family History  Problem Relation Age of Onset   Dementia Mother        Multi-infarct dementia   Stroke Mother    Diabetes Father    Cancer Paternal Grandfather        lung   Colon cancer Neg Hx    Pancreatic cancer Neg Hx    Rectal cancer Neg Hx    Stomach cancer Neg Hx    Colon polyps  Neg Hx    Social History   Socioeconomic History   Marital status: Married    Spouse name: Not on file   Number of children: 2   Years of education: Not on file   Highest education level: Not on file  Occupational History   Occupation: retired    Comment: IT work; 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      Children: two children (28, 31); no grandchildren.  Several step grandchildren (9).        Lives: with wife. 1 dog.      Employment:  Retired 06/2015.  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        Tobacco: none      Alcohol: 2 glasses of wine 4x week.  Very rare intoxication.      Drugs: none      Exercise:  Walks daily for 20 minutes 1.3 miles per day; walks dog regularly.      Seatbelt: 100% of time      Guns in home: none      Sunscreen: yes; rare sun exposure.      ADLs: independent with ADLs. Drives      Advanced Directives:  YES; living will; FULL CODE; no prolonged measures.     Social Determinants of Health   Financial Resource Strain: Not on file  Food Insecurity: Not on file  Transportation Needs: Not on file  Physical Activity: Not on file  Stress: Not on file  Social Connections: Not on file    Tobacco Counseling Counseling given: Not Answered   Clinical Intake:  Pre-visit preparation completed: Yes  Pain : No/denies pain     Nutritional Risks: None Diabetes: No  How often do you need to have someone help you when you read instructions, pamphlets, or other written materials from  your doctor or pharmacy?: 1 - Never What is the last grade level you completed in school?: [place]; Bachelor's Degree in Engineering  Diabetic? no  Interpreter Needed?: No  Information entered by :: Lisette Abu, LPN   Activities of Daily Living In your present state of health, do you have any difficulty performing the following activities: 02/28/2021  Hearing? N  Vision? N  Difficulty concentrating or making decisions? N  Walking or climbing stairs? N  Dressing or bathing? N  Doing errands,  shopping? N  Preparing Food and eating ? N  Using the Toilet? N  In the past six months, have you accidently leaked urine? N  Do you have problems with loss of bowel control? N  Managing your Medications? N  Managing your Finances? N  Housekeeping or managing your Housekeeping? N  Some recent data might be hidden    Patient Care Team: Horald Pollen, MD as PCP - General (Internal Medicine)  Indicate any recent Medical Services you may have received from other than [facility] providers in the past year (date may be approximate).     Assessment:   This is a routine wellness examination for [place].  Hearing/Vision screen Hearing Screening - Comments:: Patient denied any hearing difficulty.   Yes, patient wears hearing aids in both ears.  Vision Screening - Comments:: Patient wears corrective glasses/contacts.  Eye exam done annually by: [facility].  Dietary issues and exercise activities discussed: Current Exercise Habits: Home exercise routine, Type of exercise: walking, Time (Minutes): 30, Frequency (Times/Week): 7, Weekly Exercise (Minutes/Week): 210, Intensity: Moderate, Exercise limited by: None identified   Goals Addressed   None   Depression Screen PHQ 2/9 Scores 08/01/2020 10/24/2019 06/20/2019 10/13/2018 09/08/2018 03/12/2018 03/12/2018  PHQ - 2 Score 0 0 - 0 0 0 0  Exception Documentation - - (No Data) - - - -    Fall Risk Fall Risk  02/28/2021  08/01/2020 10/24/2019 06/20/2019 06/20/2019  Falls in the past year? 0 0 0 0 0  Number falls in past yr: 0 - 0 0 0  Injury with Fall? 0 - 0 0 0  Comment - - - - -  Risk for fall due to : No Fall Risks - - - -  Follow up Falls evaluation completed Falls evaluation completed Falls evaluation completed;Education provided Falls evaluation completed Falls evaluation completed    [place]:  Any stairs in or around the home? Yes  If so, are there any without handrails? No  Home free of loose throw rugs in walkways, pet beds, electrical cords, etc? Yes  Adequate lighting in your home to reduce risk of falls? Yes   ASSISTIVE DEVICES UTILIZED TO PREVENT FALLS:  Life alert? No  Use of a cane, walker or w/c? No  Grab bars in the bathroom? No  Shower chair or bench in shower? No  Elevated toilet seat or a handicapped toilet? No   TIMED UP AND GO:  Was the test performed? No .  Length of time to ambulate 10 feet: n/a sec.   Gait steady and fast without use of assistive device (per patient)  Cognitive Function: Normal cognitive status assessed by direct observation by this Nurse Health Advisor. No abnormalities found.       6CIT Screen 10/24/2019 09/08/2018  What Year? 0 points 0 points  What month? 0 points 0 points  What time? 0 points 0 points  Count back from 20 0 points 0 points  Months in reverse 0 points 0 points  Repeat phrase 0 points 0 points  Total Score 0 0    Immunizations Immunization History  Administered Date(s) Administered   Fluad Quad(high Dose 65+) 02/28/2019   Influenza Split 11/15/2011, 01/31/2013   Influenza, High Dose Seasonal PF 02/25/2018   Influenza,inj,Quad PF,6+ Mos 02/13/2016   Influenza-Unspecified 02/24/2014, 02/24/2017   PFIZER(Purple Top)SARS-COV-2 Vaccination 07/10/2019, 08/04/2019, 04/13/2020   Pneumococcal Conjugate-13 04/17/2014   Pneumococcal Polysaccharide-23 04/20/2015   Tdap 03/24/2012   Zoster, Live  05/02/2012    TDAP status: Up to date  Flu Vaccine status: Due, Education has been provided regarding the importance of this vaccine. Advised may receive this vaccine at local pharmacy or Health Dept. Aware to provide a copy of the vaccination record if obtained from local pharmacy or Health Dept. Verbalized acceptance and understanding.  Pneumococcal vaccine status: Up to date  Covid-19 vaccine status: Completed vaccines  Qualifies for Shingles Vaccine? Yes   Zostavax completed Yes   Shingrix Completed?: No.    Education has been provided regarding the importance of this vaccine. Patient has been advised to call insurance company to determine out of pocket expense if they have not yet received this vaccine. Advised may also receive vaccine at local pharmacy or Health Dept. Verbalized acceptance and understanding.  Screening Tests Health Maintenance  Topic Date Due   COVID-19 Vaccine (4 - Booster for Pfizer series) 07/06/2020   INFLUENZA VACCINE  12/31/2020   Zoster Vaccines- Shingrix (1 of 2) 03/22/2021 (Originally 11/11/1969)   TETANUS/TDAP  03/24/2022   COLONOSCOPY (Pts 45-77yrs Insurance coverage will need to be confirmed)  02/23/2024   Hepatitis C Screening  Completed   HPV VACCINES  Aged Out    Health Maintenance  Health Maintenance Due  Topic Date Due   COVID-19 Vaccine (4 - Booster for [place] series) 07/06/2020   INFLUENZA VACCINE  12/31/2020    Colorectal cancer screening: Type of screening: Colonoscopy. Completed 02/23/2019. Repeat every 5 years  Lung Cancer Screening: (Low Dose CT Chest recommended if Age 79-80 years, 30 pack-year currently smoking OR have quit w/in 15years.) does not qualify.   Lung Cancer Screening Referral: no  Additional Screening:  Hepatitis C Screening: does qualify; Completed: yes  Vision Screening: Recommended annual ophthalmology exams for early detection of glaucoma and other disorders of the eye. Is the patient up to date with their  annual eye exam?  Yes  Who is the provider or what is the name of the office in which the patient attends annual eye exams? [facility] If pt is not established with a provider, would they like to be referred to a provider to establish care? No .   Dental Screening: Recommended annual dental exams for proper oral hygiene  Community Resource Referral / Chronic Care Management: CRR required this visit?  No   CCM required this visit?  No      Plan:     I have personally reviewed and noted the following in the patient's chart:   Medical and social history Use of alcohol, tobacco or illicit drugs  Current medications and supplements including opioid prescriptions. Patient is not currently taking opioid prescriptions. Functional ability and status Nutritional status Physical activity Advanced directives List of other physicians Hospitalizations, surgeries, and ER visits in previous 12 months Vitals Screenings to include cognitive, depression, and falls Referrals and appointments  In addition, I have reviewed and discussed with patient certain preventive protocols, quality metrics, and best practice recommendations. A written personalized care plan for preventive services as well as general preventive health recommendations were provided to patient.     Sheral Flow, LPN   01/13/4817   Nurse Notes:  Patient is cogitatively intact. There were no vitals filed for this visit. There is no height or weight on file to calculate BMI. Patient stated that he has no issues with gait or balance; does not use any assistive devices. Medications reviewed with patient; no opioid use noted.

## 2021-02-28 NOTE — Patient Instructions (Signed)
Paul Sparks , Thank you for taking time to come for your Medicare Wellness Visit. I appreciate your ongoing commitment to your health goals. Please review the following plan we discussed and let me know if I can assist you in the future.   Screening recommendations/referrals: Colonoscopy: 02/23/2019; due every 5 years Recommended yearly ophthalmology/optometry visit for glaucoma screening and checkup Recommended yearly dental visit for hygiene and checkup  Vaccinations: Influenza vaccine: due Fall 2022 Pneumococcal vaccine: 04/17/2014, 04/20/2015 Tdap vaccine: 03/24/2012; due every 10 years Shingrix vaccine: never done; Please call your insurance company to determine your out of pocket expense for the Shingrix vaccine. You may receive this vaccine at your local pharmacy. Zoster vaccine: 05/02/2012 Covid-19:07/10/2019, 08/04/2019, 04/13/2020  Advanced directives: Please bring a copy of your health care power of attorney and living will to the office at your convenience.  Conditions/risks identified: Yes; Client understands the importance of follow-up with providers by attending scheduled visits and discussed goals to eat healthier, increase physical activity, exercise the brain, socialize more, get enough sleep and make time for laughter.  Next appointment: Please schedule your next Medicare Wellness Visit with your Nurse Health Advisor in 1 year by calling [phone].  Preventive Care 29 Years and Older, Male Preventive care refers to lifestyle choices and visits with your health care provider that can promote health and wellness. What does preventive care include? A yearly physical exam. This is also called an annual well check. Dental exams once or twice a year. Routine eye exams. Ask your health care provider how often you should have your eyes checked. Personal lifestyle choices, including: Daily care of your teeth and gums. Regular physical activity. Eating a healthy diet. Avoiding  tobacco and drug use. Limiting alcohol use. Practicing safe sex. Taking low doses of aspirin every day. Taking vitamin and mineral supplements as recommended by your health care provider. What happens during an annual well check? The services and screenings done by your health care provider during your annual well check will depend on your age, overall health, lifestyle risk factors, and family history of disease. Counseling  Your health care provider may ask you questions about your: Alcohol use. Tobacco use. Drug use. Emotional well-being. Home and relationship well-being. Sexual activity. Eating habits. History of falls. Memory and ability to understand (cognition). Work and work Statistician. Screening  You may have the following tests or measurements: Height, weight, and BMI. Blood pressure. Lipid and cholesterol levels. These may be checked every 5 years, or more frequently if you are over 105 years old. Skin check. Lung cancer screening. You may have this screening every year starting at age 56 if you have a 30-pack-year history of smoking and currently smoke or have quit within the past 15 years. Fecal occult blood test (FOBT) of the stool. You may have this test every year starting at age 22. Flexible sigmoidoscopy or colonoscopy. You may have a sigmoidoscopy every 5 years or a colonoscopy every 10 years starting at age 11. Prostate cancer screening. Recommendations will vary depending on your family history and other risks. Hepatitis C blood test. Hepatitis B blood test. Sexually transmitted disease (STD) testing. Diabetes screening. This is done by checking your blood sugar (glucose) after you have not eaten for a while (fasting). You may have this done every 1-3 years. Abdominal aortic aneurysm (AAA) screening. You may need this if you are a current or former smoker. Osteoporosis. You may be screened starting at age 42 if you are at high risk. Talk  with your health care  provider about your test results, treatment options, and if necessary, the need for more tests. Vaccines  Your health care provider may recommend certain vaccines, such as: Influenza vaccine. This is recommended every year. Tetanus, diphtheria, and acellular pertussis (Tdap, Td) vaccine. You may need a Td booster every 10 years. Zoster vaccine. You may need this after age 13. Pneumococcal 13-valent conjugate (PCV13) vaccine. One dose is recommended after age 36. Pneumococcal polysaccharide (PPSV23) vaccine. One dose is recommended after age 28. Talk to your health care provider about which screenings and vaccines you need and how often you need them. This information is not intended to replace advice given to you by your health care provider. Make sure you discuss any questions you have with your health care provider. Document Released: 06/15/2015 Document Revised: 02/06/2016 Document Reviewed: 03/20/2015 Elsevier Interactive Patient Education  2017 [place] Prevention in the Home Falls can cause injuries. They can happen to people of all ages. There are many things you can do to make your home safe and to help prevent falls. What can I do on the outside of my home? Regularly fix the edges of walkways and driveways and fix any cracks. Remove anything that might make you trip as you walk through a door, such as a raised step or threshold. Trim any bushes or trees on the path to your home. Use bright outdoor lighting. Clear any walking paths of anything that might make someone trip, such as rocks or tools. Regularly check to see if handrails are loose or broken. Make sure that both sides of any steps have handrails. Any raised decks and porches should have guardrails on the edges. Have any leaves, snow, or ice cleared regularly. Use sand or salt on walking paths during winter. Clean up any spills in your garage right away. This includes oil or grease spills. What can I do in the  bathroom? Use night lights. Install grab bars by the toilet and in the tub and shower. Do not use towel bars as grab bars. Use non-skid mats or decals in the tub or shower. If you need to sit down in the shower, use a plastic, non-slip stool. Keep the floor dry. Clean up any water that spills on the floor as soon as it happens. Remove soap buildup in the tub or shower regularly. Attach bath mats securely with double-sided non-slip rug tape. Do not have throw rugs and other things on the floor that can make you trip. What can I do in the bedroom? Use night lights. Make sure that you have a light by your bed that is easy to reach. Do not use any sheets or blankets that are too big for your bed. They should not hang down onto the floor. Have a firm chair that has side arms. You can use this for support while you get dressed. Do not have throw rugs and other things on the floor that can make you trip. What can I do in the kitchen? Clean up any spills right away. Avoid walking on wet floors. Keep items that you use a lot in easy-to-reach places. If you need to reach something above you, use a strong step stool that has a grab bar. Keep electrical cords out of the way. Do not use floor polish or wax that makes floors slippery. If you must use wax, use non-skid floor wax. Do not have throw rugs and other things on the floor that can make you  trip. What can I do with my stairs? Do not leave any items on the stairs. Make sure that there are handrails on both sides of the stairs and use them. Fix handrails that are broken or loose. Make sure that handrails are as long as the stairways. Check any carpeting to make sure that it is firmly attached to the stairs. Fix any carpet that is loose or worn. Avoid having throw rugs at the top or bottom of the stairs. If you do have throw rugs, attach them to the floor with carpet tape. Make sure that you have a light switch at the top of the stairs and the  bottom of the stairs. If you do not have them, ask someone to add them for you. What else can I do to help prevent falls? Wear shoes that: Do not have high heels. Have rubber bottoms. Are comfortable and fit you well. Are closed at the toe. Do not wear sandals. If you use a stepladder: Make sure that it is fully opened. Do not climb a closed stepladder. Make sure that both sides of the stepladder are locked into place. Ask someone to hold it for you, if possible. Clearly mark and make sure that you can see: Any grab bars or handrails. First and last steps. Where the edge of each step is. Use tools that help you move around (mobility aids) if they are needed. These include: Canes. Walkers. Scooters. Crutches. Turn on the lights when you go into a dark area. Replace any light bulbs as soon as they burn out. Set up your furniture so you have a clear path. Avoid moving your furniture around. If any of your floors are uneven, fix them. If there are any pets around you, be aware of where they are. Review your medicines with your doctor. Some medicines can make you feel dizzy. This can increase your chance of falling. Ask your doctor what other things that you can do to help prevent falls. This information is not intended to replace advice given to you by your health care provider. Make sure you discuss any questions you have with your health care provider. Document Released: 03/15/2009 Document Revised: 10/25/2015 Document Reviewed: 06/23/2014 Elsevier Interactive Patient Education  2017 [employer].

## 2021-11-28 ENCOUNTER — Encounter: Payer: Self-pay | Admitting: Emergency Medicine

## 2021-11-28 ENCOUNTER — Ambulatory Visit (INDEPENDENT_AMBULATORY_CARE_PROVIDER_SITE_OTHER): Payer: Medicare Other | Admitting: Emergency Medicine

## 2021-11-28 VITALS — BP 104/62 | HR 84 | Temp 98.6°F | Ht 71.0 in | Wt 138.0 lb

## 2021-11-28 DIAGNOSIS — Z13228 Encounter for screening for other metabolic disorders: Secondary | ICD-10-CM

## 2021-11-28 DIAGNOSIS — Z1329 Encounter for screening for other suspected endocrine disorder: Secondary | ICD-10-CM

## 2021-11-28 DIAGNOSIS — R21 Rash and other nonspecific skin eruption: Secondary | ICD-10-CM

## 2021-11-28 DIAGNOSIS — R1011 Right upper quadrant pain: Secondary | ICD-10-CM

## 2021-11-28 DIAGNOSIS — Z8639 Personal history of other endocrine, nutritional and metabolic disease: Secondary | ICD-10-CM

## 2021-11-28 DIAGNOSIS — Z0001 Encounter for general adult medical examination with abnormal findings: Secondary | ICD-10-CM

## 2021-11-28 DIAGNOSIS — Z130 Encounter for screening for diseases of the blood and blood-forming organs and certain disorders involving the immune mechanism: Secondary | ICD-10-CM

## 2021-11-28 DIAGNOSIS — L723 Sebaceous cyst: Secondary | ICD-10-CM

## 2021-11-28 DIAGNOSIS — Z125 Encounter for screening for malignant neoplasm of prostate: Secondary | ICD-10-CM

## 2021-11-28 DIAGNOSIS — Z13220 Encounter for screening for lipoid disorders: Secondary | ICD-10-CM | POA: Diagnosis not present

## 2021-11-28 LAB — CBC WITH DIFFERENTIAL/PLATELET
Basophils Absolute: 0 10*3/uL (ref 0.0–0.1)
Basophils Relative: 0.2 % (ref 0.0–3.0)
Eosinophils Absolute: 0.1 10*3/uL (ref 0.0–0.7)
Eosinophils Relative: 2.2 % (ref 0.0–5.0)
HCT: 42.2 % (ref 39.0–52.0)
Hemoglobin: 14.1 g/dL (ref 13.0–17.0)
Lymphocytes Relative: 25.4 % (ref 12.0–46.0)
Lymphs Abs: 1.1 10*3/uL (ref 0.7–4.0)
MCHC: 33.5 g/dL (ref 30.0–36.0)
MCV: 97.8 fl (ref 78.0–100.0)
Monocytes Absolute: 0.3 10*3/uL (ref 0.1–1.0)
Monocytes Relative: 7.6 % (ref 3.0–12.0)
Neutro Abs: 2.8 10*3/uL (ref 1.4–7.7)
Neutrophils Relative %: 64.6 % (ref 43.0–77.0)
Platelets: 155 10*3/uL (ref 150.0–400.0)
RBC: 4.31 Mil/uL (ref 4.22–5.81)
RDW: 12.8 % (ref 11.5–15.5)
WBC: 4.3 10*3/uL (ref 4.0–10.5)

## 2021-11-28 LAB — COMPREHENSIVE METABOLIC PANEL
ALT: 15 U/L (ref 0–53)
AST: 21 U/L (ref 0–37)
Albumin: 4.5 g/dL (ref 3.5–5.2)
Alkaline Phosphatase: 83 U/L (ref 39–117)
BUN: 15 mg/dL (ref 6–23)
CO2: 34 mEq/L — ABNORMAL HIGH (ref 19–32)
Calcium: 10 mg/dL (ref 8.4–10.5)
Chloride: 101 mEq/L (ref 96–112)
Creatinine, Ser: 0.86 mg/dL (ref 0.40–1.50)
GFR: 86.18 mL/min (ref >60.00)
Glucose, Bld: 144 mg/dL — ABNORMAL HIGH (ref 70–99)
Potassium: 4.6 mEq/L (ref 3.5–5.1)
Sodium: 138 mEq/L (ref 135–145)
Total Bilirubin: 0.5 mg/dL (ref 0.2–1.2)
Total Protein: 6.5 g/dL (ref 6.0–8.3)

## 2021-11-28 LAB — LIPID PANEL
Cholesterol: 240 mg/dL — ABNORMAL HIGH (ref 0–200)
HDL: 57.6 mg/dL (ref >39.00)
NonHDL: 182.12
Total CHOL/HDL Ratio: 4
Triglycerides: 208 mg/dL — ABNORMAL HIGH (ref 0.0–149.0)
VLDL: 41.6 mg/dL — ABNORMAL HIGH (ref 0.0–40.0)

## 2021-11-28 LAB — PSA: PSA: 3.1 ng/mL (ref 0.10–4.00)

## 2021-11-28 LAB — HEMOGLOBIN A1C: Hgb A1c MFr Bld: 5.5 % (ref 4.6–6.5)

## 2021-11-28 LAB — LDL CHOLESTEROL, DIRECT: Direct LDL: 153 mg/dL

## 2021-11-28 NOTE — Progress Notes (Signed)
Paul Sparks. 71 y.o.   Chief Complaint  Patient presents with   Annual Exam   red patch on cheek    Red patch on cheek has moved around over the years more painful    Cyst    Knot on left temporal     HISTORY OF PRESENT ILLNESS: This is a 71 y.o. male here for annual exam. Vegetarian.  Non-smoker. Drinks 1 beer on a weekly basis. Chronic facial rash.  Chronic left temporal cyst. History of thyroid disease. Occasional dizzy spells for years. Occasional right upper quadrant pain. No other complaints or medical concerns today.  HPI   Prior to Admission medications   Medication Sig Start Date End Date Taking? Authorizing Provider  citalopram (CELEXA) 40 MG tablet Take 1 tablet (40 mg total) by mouth daily. 01/26/21  Yes Tenishia Ekman, Ines Bloomer, MD  Multiple Vitamins-Minerals (MULTIVITAMIN WITH MINERALS) tablet Take 1 tablet by mouth daily.   Yes [provider]  Probiotic Product (PROBIOTIC DAILY PO) Take by mouth.   Yes [provider]  pseudoephedrine (SUDAFED) 30 MG tablet Take 30 mg by mouth daily.   Yes [provider]    Allergies  Allergen Reactions   Celebrex [Celecoxib] Hives    REACTION: Hives   Flexeril [Cyclobenzaprine Hcl] Other (See Comments)    arrhythmias    Nsaids Other (See Comments)    Unknown reaction per pt   Sulfa Drugs Cross Reactors Hives    Patient Active Problem List   Diagnosis Date Noted   Thyroid eye disease 10/13/2018   Anxiety and depression 12/20/2017   Other neutropenia ([place]) 04/20/2015   Degenerative disc disease, lumbar 08/02/2012   Degenerative disc disease, cervical 08/02/2012   Folate deficiency 04/27/2012   Pure hypercholesterolemia 04/27/2012   Irritable bowel syndrome with diarrhea 03/24/2012   Allergic rhinitis 03/24/2012   Inguinal hernia 12/10/2010   Prostate enlargement    GERD 09/21/2008    Past Medical History:  Diagnosis Date   Allergy    Anxiety    Arthritis    BPPV (benign  paroxysmal positional vertigo), unspecified laterality    Colon polyp 11/24/2013   colonoscopy [name]. Repeat in 5 years.   Dermatofibroma of back 12/11/10   [name].   Eczema 12/11/10   [name].   Genital herpes    IBD (inflammatory bowel disease)    diarrhea, abdominal cramping.   Inguinal hernia    Bilateral; s/p repair.   Prostate enlargement    mildly and asymptomatic   Seborrheic keratosis 12/11/10   [name]/Dermatology consult   Thyroid disease    Wears glasses     Past Surgical History:  Procedure Laterality Date   COLONOSCOPY  11/24/2013   four polyp. [name], MD.  Repeat in 5 years.   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      Children: two children (28, 31); no grandchildren.  Several step grandchildren (9).        Lives: with wife. 1 dog.      Employment:  Retired 06/2015.  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        Tobacco: none      Alcohol: 2 glasses of wine 4x week.  Very rare intoxication.      Drugs: none      Exercise:  Walks daily  for 20 minutes 1.3 miles per day; walks dog regularly.      Seatbelt: 100% of time      Guns in home: none      Sunscreen: yes; rare sun exposure.      ADLs: independent with ADLs. Drives      Advanced Directives:  YES; living will; FULL CODE; no prolonged measures.     Social Determinants of Health   Financial Resource Strain: Low Risk  (09/08/2018)   Overall Financial Resource Strain (CARDIA)    Difficulty of Paying Living Expenses: Not hard at all  Food Insecurity: No Food Insecurity (09/08/2018)   Hunger Vital Sign    Worried About Running Out of Food in the Last Year: Never true    Ran Out of Food in the Last Year: Never true  Transportation Needs: No Transportation Needs (09/08/2018)   PRAPARE - Hydrologist (Medical): No    Lack of Transportation (Non-Medical): No  Physical Activity: Sufficiently Active (09/08/2018)   Exercise Vital Sign    Days of Exercise per Week: 7 days    Minutes of Exercise per Session: 60 min  Stress: No Stress Concern Present (09/08/2018)   [place]    Feeling of Stress : Only a little  Social Connections: Somewhat Isolated (09/08/2018)   Social Connection and Isolation Panel [NHANES]    Frequency of Communication with Friends and Family: Twice a week    Frequency of Social Gatherings with Friends and Family: Twice a week    Attends Religious Services: Never    Marine scientist or Organizations: No    Attends Archivist Meetings: Never    Marital Status: Married  Human resources officer Violence: Not At Risk (09/08/2018)   Humiliation, Afraid, Rape, and Kick questionnaire    Fear of Current or Ex-Partner: No    Emotionally Abused: No    Physically Abused: No    Sexually Abused: No    Family History  Problem Relation Age of Onset   Dementia Mother        Multi-infarct dementia   Stroke Mother    Diabetes Father    Cancer Paternal Grandfather         lung   Colon cancer Neg Hx    Pancreatic cancer Neg Hx    Rectal cancer Neg Hx    Stomach cancer Neg Hx    Colon polyps Neg Hx      Review of Systems  Constitutional: Negative.  Negative for chills and fever.  HENT:  Negative for congestion and sore throat.   Respiratory: Negative.  Negative for cough and shortness of breath.   Cardiovascular: Negative.  Negative for chest pain and palpitations.  Gastrointestinal:  Positive for abdominal pain (Intermittent right upper quadrant pain). Negative for diarrhea, nausea and vomiting.  Genitourinary: Negative.   Musculoskeletal: Negative.   Skin:  Positive for rash (Facial).       Left temporal cyst  Neurological:  Positive for dizziness.  All other systems reviewed and are negative.  Today's Vitals   11/28/21 1456  BP: 104/62  Pulse: 84  Temp: 98.6 F (37 C)  TempSrc: Oral  SpO2: 94%  Weight: 138 lb (62.6 kg)  Height: '5\' 11"'$  (1.803 m)   Body mass index is 19.25 kg/m.   Physical Exam Vitals reviewed.  Constitutional:      Appearance: Normal appearance.  HENT:     Head: Normocephalic and atraumatic.     Right Ear: Tympanic membrane, ear canal and external ear normal.     Left Ear: Tympanic membrane, ear canal and external ear normal.  Eyes:     Extraocular Movements: Extraocular movements intact.     Conjunctiva/sclera: Conjunctivae normal.     Pupils: Pupils are equal, round, and reactive to light.  Cardiovascular:     Rate and Rhythm: Normal rate and regular rhythm.     Pulses: Normal pulses.     Heart sounds: Normal heart sounds.  Pulmonary:     Effort: Pulmonary effort is normal.     Breath sounds: Normal breath sounds.  Abdominal:     General: Bowel sounds are normal. There is no distension.     Palpations: Abdomen is soft. There is no mass.     Tenderness: There is no abdominal tenderness.  Musculoskeletal:     Cervical back: No tenderness.  Lymphadenopathy:     Cervical: No cervical adenopathy.  Skin:     General: Skin is warm and dry.     Capillary Refill: Capillary refill takes less than 2 seconds.     Comments: Left temporal sebaceous cyst Erythematous rash to left cheek  Neurological:     General: No focal deficit present.     Mental Status: He is alert and oriented to person, place, and time.  Psychiatric:        Mood and Affect: Mood normal.        Behavior: Behavior normal.      ASSESSMENT & PLAN: Problem List Items Addressed This Visit   None Visit Diagnoses     Encounter for general adult medical examination with abnormal findings    -  Primary   Rash of face       Relevant Orders   Ambulatory referral to Dermatology   Sebaceous cyst       Relevant Orders   Ambulatory referral to Dermatology   Right upper quadrant abdominal pain       Relevant Orders   US Abdomen Limited RUQ (LIVER/GB)   CBC with Differential   Comprehensive metabolic panel   History of Graves' disease       Relevant Orders   Thyroid Panel With TSH   Prostate cancer screening       Relevant Orders   PSA(Must document that pt has been informed of limitations of PSA testing.)   Screening for deficiency anemia       Relevant Orders   CBC with Differential   Screening for  lipoid disorders       Relevant Orders   Lipid panel   Screening for endocrine, metabolic and immunity disorder       Relevant Orders   Comprehensive metabolic panel   Hemoglobin A1c      Modifiable risk factors discussed with patient. Anticipatory guidance according to age provided. The following topics were also discussed: Social Determinants of Health Smoking.  Non-smoker Diet and nutrition.  Vegetarian Benefits of exercise.  Exercises regularly Cancer screening and review of colonoscopy report from 2020 Vaccinations recommendations Cardiovascular risk assessment and need for blood work Skin findings and need for dermatology evaluation Need for gallbladder ultrasound due to chronic right upper quadrant  pain Mental health including depression and anxiety Fall and accident prevention  Patient Instructions  Health Maintenance, Male Adopting a healthy lifestyle and getting preventive care are important in promoting health and wellness. Ask your health care provider about: The right schedule for you to have regular tests and exams. Things you can do on your own to prevent diseases and keep yourself healthy. What should I know about diet, weight, and exercise? Eat a healthy diet  Eat a diet that includes plenty of vegetables, fruits, low-fat dairy products, and lean protein. Do not eat a lot of foods that are high in solid fats, added sugars, or sodium. Maintain a healthy weight Body mass index (BMI) is a measurement that can be used to identify possible weight problems. It estimates body fat based on height and weight. Your health care provider can help determine your BMI and help you achieve or maintain a healthy weight. Get regular exercise Get regular exercise. This is one of the most important things you can do for your health. Most adults should: Exercise for at least 150 minutes each week. The exercise should increase your heart rate and make you sweat (moderate-intensity exercise). Do strengthening exercises at least twice a week. This is in addition to the moderate-intensity exercise. Spend less time sitting. Even light physical activity can be beneficial. Watch cholesterol and blood lipids Have your blood tested for lipids and cholesterol at 71 years of age, then have this test every 5 years. You may need to have your cholesterol levels checked more often if: Your lipid or cholesterol levels are high. You are older than 73 years of age. You are at high risk for heart disease. What should I know about cancer screening? Many types of cancers can be detected early and may often be prevented. Depending on your health history and family history, you may need to have cancer screening at  various ages. This may include screening for: Colorectal cancer. Prostate cancer. Skin cancer. Lung cancer. What should I know about heart disease, diabetes, and high blood pressure? Blood pressure and heart disease High blood pressure causes heart disease and increases the risk of stroke. This is more likely to develop in people who have high blood pressure readings or are overweight. Talk with your health care provider about your target blood pressure readings. Have your blood pressure checked: Every 3-5 years if you are 23-75 years of age. Every year if you are 66 years old or older. If you are between the ages of 16 and 30 and are a current or former smoker, ask your health care provider if you should have a one-time screening for abdominal aortic aneurysm (AAA). Diabetes Have regular diabetes screenings. This checks your fasting blood sugar level. Have the screening done: Once every three years after age 62 if you  are at a normal weight and have a low risk for diabetes. More often and at a younger age if you are overweight or have a high risk for diabetes. What should I know about preventing infection? Hepatitis B If you have a higher risk for hepatitis B, you should be screened for this virus. Talk with your health care provider to find out if you are at risk for hepatitis B infection. Hepatitis C Blood testing is recommended for: Everyone born from [birth year] through 1967. Anyone with known risk factors for hepatitis C. Sexually transmitted infections (STIs) You should be screened each year for STIs, including gonorrhea and chlamydia, if: You are sexually active and are younger than 73 years of age. You are older than 73 years of age and your health care provider tells you that you are at risk for this type of infection. Your sexual activity has changed since you were last screened, and you are at increased risk for chlamydia or gonorrhea. Ask your health care provider if you are at  risk. Ask your health care provider about whether you are at high risk for HIV. Your health care provider may recommend a prescription medicine to help prevent HIV infection. If you choose to take medicine to prevent HIV, you should first get tested for HIV. You should then be tested every 3 months for as long as you are taking the medicine. Follow these instructions at home: Alcohol use Do not drink alcohol if your health care provider tells you not to drink. If you drink alcohol: Limit how much you have to 0-2 drinks a day. Know how much alcohol is in your drink. In the [place] one drink equals one 12 oz bottle of beer (355 mL), one 5 oz glass of wine (148 mL), or one 1 oz glass of hard liquor (44 mL). Lifestyle Do not use any products that contain nicotine or tobacco. These products include cigarettes, chewing tobacco, and vaping devices, such as e-cigarettes. If you need help quitting, ask your health care provider. Do not use street drugs. Do not share needles. Ask your health care provider for help if you need support or information about quitting drugs. General instructions Schedule regular health, dental, and eye exams. Stay current with your vaccines. Tell your health care provider if: You often feel depressed. You have ever been abused or do not feel safe at home. Summary Adopting a healthy lifestyle and getting preventive care are important in promoting health and wellness. Follow your health care provider's instructions about healthy diet, exercising, and getting tested or screened for diseases. Follow your health care provider's instructions on monitoring your cholesterol and blood pressure. This information is not intended to replace advice given to you by your health care provider. Make sure you discuss any questions you have with your health care provider. Document Revised: 10/08/2020 Document Reviewed: 10/08/2020 Elsevier Patient Education  [place], MD [facility]

## 2021-11-28 NOTE — Patient Instructions (Signed)
Health Maintenance, Male Adopting a healthy lifestyle and getting preventive care are important in promoting health and wellness. Ask your health care provider about: The right schedule for you to have regular tests and exams. Things you can do on your own to prevent diseases and keep yourself healthy. What should I know about diet, weight, and exercise? Eat a healthy diet  Eat a diet that includes plenty of vegetables, fruits, low-fat dairy products, and lean protein. Do not eat a lot of foods that are high in solid fats, added sugars, or sodium. Maintain a healthy weight Body mass index (BMI) is a measurement that can be used to identify possible weight problems. It estimates body fat based on height and weight. Your health care provider can help determine your BMI and help you achieve or maintain a healthy weight. Get regular exercise Get regular exercise. This is one of the most important things you can do for your health. Most adults should: Exercise for at least 150 minutes each week. The exercise should increase your heart rate and make you sweat (moderate-intensity exercise). Do strengthening exercises at least twice a week. This is in addition to the moderate-intensity exercise. Spend less time sitting. Even light physical activity can be beneficial. Watch cholesterol and blood lipids Have your blood tested for lipids and cholesterol at 71 years of age, then have this test every 5 years. You may need to have your cholesterol levels checked more often if: Your lipid or cholesterol levels are high. You are older than 73 years of age. You are at high risk for heart disease. What should I know about cancer screening? Many types of cancers can be detected early and may often be prevented. Depending on your health history and family history, you may need to have cancer screening at various ages. This may include screening for: Colorectal cancer. Prostate cancer. Skin cancer. Lung  cancer. What should I know about heart disease, diabetes, and high blood pressure? Blood pressure and heart disease High blood pressure causes heart disease and increases the risk of stroke. This is more likely to develop in people who have high blood pressure readings or are overweight. Talk with your health care provider about your target blood pressure readings. Have your blood pressure checked: Every 3-5 years if you are 18-39 years of age. Every year if you are 40 years old or older. If you are between the ages of 65 and 75 and are a current or former smoker, ask your health care provider if you should have a one-time screening for abdominal aortic aneurysm (AAA). Diabetes Have regular diabetes screenings. This checks your fasting blood sugar level. Have the screening done: Once every three years after age 45 if you are at a normal weight and have a low risk for diabetes. More often and at a younger age if you are overweight or have a high risk for diabetes. What should I know about preventing infection? Hepatitis B If you have a higher risk for hepatitis B, you should be screened for this virus. Talk with your health care provider to find out if you are at risk for hepatitis B infection. Hepatitis C Blood testing is recommended for: Everyone born from 1945 through 1965. Anyone with known risk factors for hepatitis C. Sexually transmitted infections (STIs) You should be screened each year for STIs, including gonorrhea and chlamydia, if: You are sexually active and are younger than 73 years of age. You are older than 73 years of age and your   health care provider tells you that you are at risk for this type of infection. Your sexual activity has changed since you were last screened, and you are at increased risk for chlamydia or gonorrhea. Ask your health care provider if you are at risk. Ask your health care provider about whether you are at high risk for HIV. Your health care provider  may recommend a prescription medicine to help prevent HIV infection. If you choose to take medicine to prevent HIV, you should first get tested for HIV. You should then be tested every 3 months for as long as you are taking the medicine. Follow these instructions at home: Alcohol use Do not drink alcohol if your health care provider tells you not to drink. If you drink alcohol: Limit how much you have to 0-2 drinks a day. Know how much alcohol is in your drink. In the [place] one drink equals one 12 oz bottle of beer (355 mL), one 5 oz glass of wine (148 mL), or one 1 oz glass of hard liquor (44 mL). Lifestyle Do not use any products that contain nicotine or tobacco. These products include cigarettes, chewing tobacco, and vaping devices, such as e-cigarettes. If you need help quitting, ask your health care provider. Do not use street drugs. Do not share needles. Ask your health care provider for help if you need support or information about quitting drugs. General instructions Schedule regular health, dental, and eye exams. Stay current with your vaccines. Tell your health care provider if: You often feel depressed. You have ever been abused or do not feel safe at home. Summary Adopting a healthy lifestyle and getting preventive care are important in promoting health and wellness. Follow your health care provider's instructions about healthy diet, exercising, and getting tested or screened for diseases. Follow your health care provider's instructions on monitoring your cholesterol and blood pressure. This information is not intended to replace advice given to you by your health care provider. Make sure you discuss any questions you have with your health care provider. Document Revised: 10/08/2020 Document Reviewed: 10/08/2020 Elsevier Patient Education  2023 Elsevier Inc.  

## 2021-11-29 LAB — THYROID PANEL WITH TSH
Free Thyroxine Index: 1.9 (ref 1.4–3.8)
T3 Uptake: 31 % (ref 22–35)
T4, Total: 6 ug/dL (ref 4.9–10.5)
TSH: 0.99 mIU/L (ref 0.40–4.50)

## 2021-12-04 ENCOUNTER — Other Ambulatory Visit: Payer: Self-pay | Admitting: Emergency Medicine

## 2021-12-04 ENCOUNTER — Encounter: Payer: Self-pay | Admitting: Emergency Medicine

## 2021-12-04 DIAGNOSIS — E785 Hyperlipidemia, unspecified: Secondary | ICD-10-CM

## 2021-12-04 MED ORDER — ROSUVASTATIN CALCIUM 10 MG PO TABS: 10.0000 mg | ORAL_TABLET | Freq: Every day | ORAL | 3 refills | Status: DC

## 2021-12-04 NOTE — Telephone Encounter (Signed)
Prescription for rosuvastatin 10 mg daily sent to pharmacy of record today.  Thanks.

## 2022-01-10 ENCOUNTER — Ambulatory Visit
Admission: RE | Admit: 2022-01-10 | Discharge: 2022-01-10 | Disposition: A | Discharge status: Home / Self Care | Payer: Medicare Other | Source: Ambulatory Visit | Attending: Emergency Medicine | Admitting: Emergency Medicine

## 2022-01-10 DIAGNOSIS — R1011 Right upper quadrant pain: Secondary | ICD-10-CM | POA: Diagnosis not present

## 2022-01-13 ENCOUNTER — Other Ambulatory Visit: Payer: Self-pay | Admitting: Emergency Medicine

## 2022-02-19 ENCOUNTER — Telehealth: Payer: Self-pay | Admitting: Emergency Medicine

## 2022-02-19 NOTE — Telephone Encounter (Signed)
Patient is interested, asked for a call back.

## 2022-02-19 NOTE — Telephone Encounter (Signed)
Left message for patient to call back to schedule Medicare Annual Wellness Visit   Last AWV  02/28/21  Please schedule at anytime with [facility]-Nurse Health Advisor if patient calls the office back.     Any questions, please call me at [phone]  

## 2022-02-21 NOTE — Telephone Encounter (Signed)
Called patient back to schedule AWV, unable to speak with patient-LVM to call me back .

## 2022-03-05 DIAGNOSIS — H2513 Age-related nuclear cataract, bilateral: Secondary | ICD-10-CM | POA: Diagnosis not present

## 2022-03-06 ENCOUNTER — Ambulatory Visit (INDEPENDENT_AMBULATORY_CARE_PROVIDER_SITE_OTHER): Payer: Medicare Other

## 2022-03-06 VITALS — BP 110/55 | HR 75 | Temp 97.7°F | Resp 15 | Ht 71.0 in | Wt 141.0 lb

## 2022-03-06 DIAGNOSIS — Z0000 Encounter for general adult medical examination without abnormal findings: Secondary | ICD-10-CM | POA: Diagnosis not present

## 2022-03-06 NOTE — Progress Notes (Signed)
Virtual Visit via Telephone Note  I connected with  Paul Sparks. on 03/06/22 at  2:45 PM EDT by telephone and verified that I am speaking with the correct person using two identifiers.  Location: Patient: Home Provider: [place] Persons participating in the virtual visit: [place]   I discussed the limitations, risks, security and privacy concerns of performing an evaluation and management service by telephone and the availability of in person appointments. The patient expressed understanding and agreed to proceed.  Interactive audio and video telecommunications were attempted between this nurse and patient, however failed, due to patient having technical difficulties OR patient did not have access to video capability.  We continued and completed visit with audio only.  Some vital signs may be absent or patient reported.   Sheral Flow, LPN  Subjective:   Paul Sparks. is a 71 y.o. male who presents for Medicare Annual/Subsequent preventive examination.  Review of Systems     Cardiac Risk Factors include: advanced age (>43mn, >>46women);family history of premature cardiovascular disease;male gender     Objective:    Today's Vitals   03/06/22 1505  BP: (!) 110/55  Pulse: 75  Resp: 15  Temp: 97.7 F (36.5 C)  Weight: 141 lb (64 kg)  Height: '5\' 11"'$  (1.803 m)  PainSc: 0-No pain   Body mass index is 19.67 kg/m.     03/06/2022    2:50 PM 02/28/2021    3:51 PM 10/24/2019   10:06 AM 09/08/2018    9:21 AM 04/29/2016    9:02 AM 11/24/2013    7:51 AM 11/10/2013   10:30 AM  Advanced Directives  Does Patient Have a Medical Advance Directive? Yes Yes Yes Yes No Patient does not have advance directive Patient does not have advance directive  Type of Advance Directive [place] Living will;Healthcare Power of [place] will     Does patient want  to make changes to medical advance directive?  No - Patient declined No - Patient declined      Copy of [place]? No - copy requested No - copy requested No - copy requested      Pre-existing out of facility DNR order (yellow form or pink MOST form)       No    Current Medications (verified) Outpatient Encounter Medications as of 03/06/2022  Medication Sig   citalopram (CELEXA) 40 MG tablet TAKE 1 TABLET BY MOUTH EVERY DAY   Multiple Vitamins-Minerals (MULTIVITAMIN WITH MINERALS) tablet Take 1 tablet by mouth daily.   Probiotic Product (PROBIOTIC DAILY PO) Take by mouth.   pseudoephedrine (SUDAFED) 30 MG tablet Take 30 mg by mouth daily.   rosuvastatin (CRESTOR) 10 MG tablet Take 1 tablet (10 mg total) by mouth daily.   No facility-administered encounter medications on file as of 03/06/2022.    Allergies (verified) Celebrex [celecoxib], Flexeril [cyclobenzaprine hcl], Nsaids, and Sulfa drugs cross reactors   History: Past Medical History:  Diagnosis Date   Allergy    Anxiety    Arthritis    BPPV (benign paroxysmal positional vertigo), unspecified laterality    Colon polyp 11/24/2013   colonoscopy MLucio Edward Repeat in 5 years.   Dermatofibroma of back 12/11/10   [name].   Eczema 12/11/10   [name].   Genital herpes    IBD (inflammatory bowel disease)    diarrhea, abdominal cramping.   Inguinal hernia  Bilateral; s/p repair.   Prostate enlargement    mildly and asymptomatic   Seborrheic keratosis 12/11/10   [name]/Dermatology consult   Thyroid disease    Wears glasses    Past Surgical History:  Procedure Laterality Date   COLONOSCOPY  11/24/2013   four polyp. [name], MD.  Repeat in 5 years.   HERNIA REPAIR N/A    [facility] 07/31/2020   INGUINAL HERNIA REPAIR  2005   right   KNEE ARTHROSCOPY     POLYPECTOMY     TONSILLECTOMY     VASECTOMY     Family History  Problem Relation Age of Onset   Dementia  Mother        Multi-infarct dementia   Stroke Mother    Diabetes Father    Cancer Paternal Grandfather        lung   Colon cancer Neg Hx    Pancreatic cancer Neg Hx    Rectal cancer Neg Hx    Stomach cancer Neg Hx    Colon polyps Neg Hx    Social History   Socioeconomic History   Marital status: Married    Spouse name: Not on file   Number of children: 2   Years of education: Not on file   Highest education level: Not on file  Occupational History   Occupation: retired    Comment: IT work; 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      Children: two children (28, 31); no grandchildren.  Several step grandchildren (9).        Lives: with wife. 1 dog.      Employment:  Retired 06/2015.  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        Tobacco: none      Alcohol: 2 glasses of wine 4x week.  Very rare intoxication.      Drugs: none      Exercise:  Walks daily for 20 minutes 1.3 miles per day; walks dog regularly.      Seatbelt: 100% of time      Guns in home: none      Sunscreen: yes; rare sun exposure.      ADLs: independent with ADLs. Drives      Advanced Directives:  YES; living will; FULL CODE; no prolonged measures.     Social Determinants of Health   Financial Resource Strain: Low Risk  (03/06/2022)   Overall Financial Resource Strain (CARDIA)    Difficulty of Paying Living Expenses: Not hard at all  Food Insecurity: No Food Insecurity (03/06/2022)   Hunger Vital Sign    Worried About Running Out  of Food in the Last Year: Never true    Ran Out of Food in the Last Year: Never true  Transportation Needs: No Transportation Needs (03/06/2022)   PRAPARE - Hydrologist (Medical): No    Lack of Transportation (Non-Medical): No  Physical Activity:  Sufficiently Active (03/06/2022)   Exercise Vital Sign    Days of Exercise per Week: 7 days    Minutes of Exercise per Session: 60 min  Stress: No Stress Concern Present (03/06/2022)   [place]    Feeling of Stress : Not at all  Social Connections: Moderately Isolated (03/06/2022)   Social Connection and Isolation Panel [NHANES]    Frequency of Communication with Friends and Family: Twice a week    Frequency of Social Gatherings with Friends and Family: Twice a week    Attends Religious Services: Never    Marine scientist or Organizations: No    Attends Music therapist: Never    Marital Status: Married    Tobacco Counseling Counseling given: Not Answered   Clinical Intake:  Pre-visit preparation completed: Yes  Pain : No/denies pain Pain Score: 0-No pain     BMI - recorded: 19.67 Nutritional Status: BMI of 19-24  Normal Nutritional Risks: None Diabetes: No  How often do you need to have someone help you when you read instructions, pamphlets, or other written materials from your doctor or pharmacy?: 1 - Never What is the last grade level you completed in school?: [employer]; [employer] IN ENGINEERING  Diabetic? no  Interpreter Needed?: No  Information entered by :: Lisette Abu, LPN.   Activities of Daily Living    03/06/2022    3:00 PM  In your present state of health, do you have any difficulty performing the following activities:  Hearing? 0  Vision? 0  Difficulty concentrating or making decisions? 0  Walking or climbing stairs? 0  Dressing or bathing? 0  Doing errands, shopping? 0  Preparing Food  and eating ? N  Using the Toilet? N  In the past six months, have you accidently leaked urine? N  Do you have problems with loss of bowel control? N  Managing your Medications? N  Managing your Finances? N  Housekeeping or managing your Housekeeping? N    Patient Care Team: Horald Pollen, MD as PCP - General (Internal Medicine) Willow Ora as Consulting Physician (Optometry)  Indicate any recent Medical Services you may have received from other than [facility] providers in the past year (date may be approximate).     Assessment:   This is a routine wellness examination for [place].  Hearing/Vision screen Hearing Screening - Comments:: Has hearing aids. Vision Screening - Comments:: Wears rx glasses - up to date with routine eye exams with Dr. Willow Ora   Dietary issues and exercise activities discussed: Current Exercise Habits: Home exercise routine, Type of exercise: walking;treadmill;stretching;strength training/weights;exercise ball;calisthenics, Time (Minutes): 60, Frequency (Times/Week): 7, Weekly Exercise (Minutes/Week): 420, Intensity: Moderate, Exercise limited by: None identified   Goals Addressed             This Visit's Progress    My goal is to continue to be physically active and maintain my health.        Depression Screen    03/06/2022    2:53 PM 11/28/2021    3:00 PM 08/01/2020    1:18 PM 10/24/2019   10:07 AM 10/13/2018    7:48 AM 09/08/2018    9:24 AM 03/12/2018    2:19 PM  PHQ 2/9 Scores  PHQ - 2 Score 0 1 0 0 0 0 0    Fall Risk    03/06/2022    2:51 PM 11/28/2021    2:59 PM 02/28/2021  3:52 PM 08/01/2020    1:17 PM 10/24/2019   10:07 AM  Fall Risk   Falls in the past year? 0 0 0 0 0  Number falls in past yr: 0  0  0  Injury with Fall? 0  0  0  Risk for fall due to : No Fall Risks  No Fall Risks    Follow up Falls prevention discussed  Falls evaluation completed Falls evaluation completed Falls evaluation completed;Education provided    FALL  RISK PREVENTION PERTAINING TO THE HOME:  Any stairs in or around the home? Yes  If so, are there any without handrails? No  Home free of loose throw rugs in walkways, pet beds, electrical cords, etc? Yes  Adequate lighting in your home to reduce risk of falls? Yes   ASSISTIVE DEVICES UTILIZED TO PREVENT FALLS:  Life alert? No  Use of a cane, walker or w/c? No  Grab bars in the bathroom? Yes  Shower chair or bench in shower? No  Elevated toilet seat or a handicapped toilet? No   TIMED UP AND GO:  Was the test performed? No . Phone Visit  Cognitive Function:        03/06/2022    3:08 PM 10/24/2019   10:06 AM 09/08/2018    9:30 AM  6CIT Screen  What Year? 0 points 0 points 0 points  What month? 0 points 0 points 0 points  What time? 0 points 0 points 0 points  Count back from 20 0 points 0 points 0 points  Months in reverse 0 points 0 points 0 points  Repeat phrase 0 points 0 points 0 points  Total Score 0 points 0 points 0 points    Immunizations Immunization History  Administered Date(s) Administered   Fluad Quad(high Dose 65+) 02/28/2019   Influenza Split 11/15/2011, 01/31/2013   Influenza, High Dose Seasonal PF 02/24/2017, 02/25/2018   Influenza,inj,Quad PF,6+ Mos 02/13/2016   Influenza-Unspecified 02/24/2014, 02/24/2017, 03/05/2020   PFIZER(Purple Top)SARS-COV-2 Vaccination 07/10/2019, 08/04/2019, 04/13/2020, 03/26/2021   Pfizer Covid-19 Vaccine Bivalent Booster 72yr & up 03/26/2021   Pneumococcal Conjugate-13 04/17/2014   Pneumococcal Polysaccharide-23 04/20/2015   Tdap 03/24/2012   Zoster, Live 05/02/2012    TDAP status: Up to date  Flu Vaccine status: Due, Education has been provided regarding the importance of this vaccine. Advised may receive this vaccine at local pharmacy or Health Dept. Aware to provide a copy of the vaccination record if obtained from local pharmacy or Health Dept. Verbalized acceptance and understanding.  Pneumococcal vaccine status:  Up to date  Covid-19 vaccine status: Completed vaccines  Qualifies for Shingles Vaccine? Yes   Zostavax completed Yes   Shingrix Completed?: No.    Education has been provided regarding the importance of this vaccine. Patient has been advised to call insurance company to determine out of pocket expense if they have not yet received this vaccine. Advised may also receive vaccine at local pharmacy or Health Dept. Verbalized acceptance and understanding.  Screening Tests Health Maintenance  Topic Date Due   Zoster Vaccines- Shingrix (1 of 2) Never done   COVID-19 Vaccine (5 - Pfizer risk series) 05/21/2021   INFLUENZA VACCINE  12/31/2021   TETANUS/TDAP  03/24/2022   COLONOSCOPY (Pts 45-410yrInsurance coverage will need to be confirmed)  02/23/2024   Pneumonia Vaccine 6537Years old  Completed   Hepatitis C Screening  Completed   HPV VACCINES  Aged Out    Health Maintenance  Health Maintenance Due  Topic Date  Due   Zoster Vaccines- Shingrix (1 of 2) Never done   COVID-19 Vaccine (5 - Pfizer risk series) 05/21/2021   INFLUENZA VACCINE  12/31/2021    Colorectal cancer screening: Type of screening: Colonoscopy. Completed 02/23/2019. Repeat every 5 years  Lung Cancer Screening: (Low Dose CT Chest recommended if Age 24-80 years, 30 pack-year currently smoking OR have quit w/in 15years.) does not qualify.   Lung Cancer Screening Referral: no  Additional Screening:  Hepatitis C Screening: does qualify; Completed 04/20/2015  Vision Screening: Recommended annual ophthalmology exams for early detection of glaucoma and other disorders of the eye. Is the patient up to date with their annual eye exam?  Yes  Who is the provider or what is the name of the office in which the patient attends annual eye exams? Pat Patrick, MD. If pt is not established with a provider, would they like to be referred to a provider to establish care? No .   Dental Screening: Recommended annual dental exams for  proper oral hygiene  Community Resource Referral / Chronic Care Management: CRR required this visit?  No   CCM required this visit?  No      Plan:     I have personally reviewed and noted the following in the patient's chart:   Medical and social history Use of alcohol, tobacco or illicit drugs  Current medications and supplements including opioid prescriptions. Patient is not currently taking opioid prescriptions. Functional ability and status Nutritional status Physical activity Advanced directives List of other physicians Hospitalizations, surgeries, and ER visits in previous 12 months Vitals Screenings to include cognitive, depression, and falls Referrals and appointments  In addition, I have reviewed and discussed with patient certain preventive protocols, quality metrics, and best practice recommendations. A written personalized care plan for preventive services as well as general preventive health recommendations were provided to patient.     Sheral Flow, LPN   [id number]/07/[id number]   Nurse Notes: N/A

## 2022-03-06 NOTE — Patient Instructions (Signed)
Paul Sparks , Thank you for taking time to come for your Medicare Wellness Visit. I appreciate your ongoing commitment to your health goals. Please review the following plan we discussed and let me know if I can assist you in the future.   These are the goals we discussed:  Goals      My goal is to continue to be physically active and maintain my health.        This is a list of the screening recommended for you and due dates:  Health Maintenance  Topic Date Due   Zoster (Shingles) Vaccine (1 of 2) Never done   COVID-19 Vaccine (5 - Pfizer risk series) 05/21/2021   Flu Shot  12/31/2021   Tetanus Vaccine  03/24/2022   Colon Cancer Screening  02/23/2024   Pneumonia Vaccine  Completed   Hepatitis C Screening: USPSTF Recommendation to screen - Ages 18-79 yo.  Completed   HPV Vaccine  Aged Out    Advanced directives: Yes  Conditions/risks identified: Yes  Next appointment: Follow up in one year for your annual wellness visit.   Preventive Care 56 Years and Older, Male Preventive care refers to lifestyle choices and visits with your health care provider that can promote health and wellness. What does preventive care include? A yearly physical exam. This is also called an annual well check. Dental exams once or twice a year. Routine eye exams. Ask your health care provider how often you should have your eyes checked. Personal lifestyle choices, including: Daily care of your teeth and gums. Regular physical activity. Eating a healthy diet. Avoiding tobacco and drug use. Limiting alcohol use. Practicing safe sex. Taking low-dose aspirin every day. Taking vitamin and mineral supplements as recommended by your health care provider. What happens during an annual well check? The services and screenings done by your health care provider during your annual well check will depend on your age, overall health, lifestyle risk factors, and family history of disease. Counseling  Your health  care provider may ask you questions about your: Alcohol use. Tobacco use. Drug use. Emotional well-being. Home and relationship well-being. Sexual activity. Eating habits. History of falls. Memory and ability to understand (cognition). Work and work Statistician. Reproductive health. Screening  You may have the following tests or measurements: Height, weight, and BMI. Blood pressure. Lipid and cholesterol levels. These may be checked every 5 years, or more frequently if you are over 4 years old. Skin check. Lung cancer screening. You may have this screening every year starting at age 9 if you have a 30-pack-year history of smoking and currently smoke or have quit within the past 15 years. Fecal occult blood test (FOBT) of the stool. You may have this test every year starting at age 78. Flexible sigmoidoscopy or colonoscopy. You may have a sigmoidoscopy every 5 years or a colonoscopy every 10 years starting at age 75. Hepatitis C blood test. Hepatitis B blood test. Sexually transmitted disease (STD) testing. Diabetes screening. This is done by checking your blood sugar (glucose) after you have not eaten for a while (fasting). You may have this done every 1-3 years. Bone density scan. This is done to screen for osteoporosis. You may have this done starting at age 54. Mammogram. This may be done every 1-2 years. Talk to your health care provider about how often you should have regular mammograms. Talk with your health care provider about your test results, treatment options, and if necessary, the need for more tests. Vaccines  Your health care provider may recommend certain vaccines, such as: Influenza vaccine. This is recommended every year. Tetanus, diphtheria, and acellular pertussis (Tdap, Td) vaccine. You may need a Td booster every 10 years. Zoster vaccine. You may need this after age 33. Pneumococcal 13-valent conjugate (PCV13) vaccine. One dose is recommended after age  70. Pneumococcal polysaccharide (PPSV23) vaccine. One dose is recommended after age 43. Talk to your health care provider about which screenings and vaccines you need and how often you need them. This information is not intended to replace advice given to you by your health care provider. Make sure you discuss any questions you have with your health care provider. Document Released: 06/15/2015 Document Revised: 02/06/2016 Document Reviewed: 03/20/2015 Elsevier Interactive Patient Education  2017 [employer] Prevention in the Home Falls can cause injuries. They can happen to people of all ages. There are many things you can do to make your home safe and to help prevent falls. What can I do on the outside of my home? Regularly fix the edges of walkways and driveways and fix any cracks. Remove anything that might make you trip as you walk through a door, such as a raised step or threshold. Trim any bushes or trees on the path to your home. Use bright outdoor lighting. Clear any walking paths of anything that might make someone trip, such as rocks or tools. Regularly check to see if handrails are loose or broken. Make sure that both sides of any steps have handrails. Any raised decks and porches should have guardrails on the edges. Have any leaves, snow, or ice cleared regularly. Use sand or salt on walking paths during winter. Clean up any spills in your garage right away. This includes oil or grease spills. What can I do in the bathroom? Use night lights. Install grab bars by the toilet and in the tub and shower. Do not use towel bars as grab bars. Use non-skid mats or decals in the tub or shower. If you need to sit down in the shower, use a plastic, non-slip stool. Keep the floor dry. Clean up any water that spills on the floor as soon as it happens. Remove soap buildup in the tub or shower regularly. Attach bath mats securely with double-sided non-slip rug tape. Do not have throw  rugs and other things on the floor that can make you trip. What can I do in the bedroom? Use night lights. Make sure that you have a light by your bed that is easy to reach. Do not use any sheets or blankets that are too big for your bed. They should not hang down onto the floor. Have a firm chair that has side arms. You can use this for support while you get dressed. Do not have throw rugs and other things on the floor that can make you trip. What can I do in the kitchen? Clean up any spills right away. Avoid walking on wet floors. Keep items that you use a lot in easy-to-reach places. If you need to reach something above you, use a strong step stool that has a grab bar. Keep electrical cords out of the way. Do not use floor polish or wax that makes floors slippery. If you must use wax, use non-skid floor wax. Do not have throw rugs and other things on the floor that can make you trip. What can I do with my stairs? Do not leave any items on the stairs. Make sure that there are  handrails on both sides of the stairs and use them. Fix handrails that are broken or loose. Make sure that handrails are as long as the stairways. Check any carpeting to make sure that it is firmly attached to the stairs. Fix any carpet that is loose or worn. Avoid having throw rugs at the top or bottom of the stairs. If you do have throw rugs, attach them to the floor with carpet tape. Make sure that you have a light switch at the top of the stairs and the bottom of the stairs. If you do not have them, ask someone to add them for you. What else can I do to help prevent falls? Wear shoes that: Do not have high heels. Have rubber bottoms. Are comfortable and fit you well. Are closed at the toe. Do not wear sandals. If you use a stepladder: Make sure that it is fully opened. Do not climb a closed stepladder. Make sure that both sides of the stepladder are locked into place. Ask someone to hold it for you, if  possible. Clearly mark and make sure that you can see: Any grab bars or handrails. First and last steps. Where the edge of each step is. Use tools that help you move around (mobility aids) if they are needed. These include: Canes. Walkers. Scooters. Crutches. Turn on the lights when you go into a dark area. Replace any light bulbs as soon as they burn out. Set up your furniture so you have a clear path. Avoid moving your furniture around. If any of your floors are uneven, fix them. If there are any pets around you, be aware of where they are. Review your medicines with your doctor. Some medicines can make you feel dizzy. This can increase your chance of falling. Ask your doctor what other things that you can do to help prevent falls. This information is not intended to replace advice given to you by your health care provider. Make sure you discuss any questions you have with your health care provider. Document Released: 03/15/2009 Document Revised: 10/25/2015 Document Reviewed: 06/23/2014 Elsevier Interactive Patient Education  2017 [employer].

## 2022-12-06 ENCOUNTER — Other Ambulatory Visit: Payer: Self-pay | Admitting: Emergency Medicine

## 2022-12-06 DIAGNOSIS — E785 Hyperlipidemia, unspecified: Secondary | ICD-10-CM

## 2023-01-08 ENCOUNTER — Other Ambulatory Visit: Payer: Self-pay | Admitting: Emergency Medicine

## 2023-04-03 DIAGNOSIS — H43811 Vitreous degeneration, right eye: Secondary | ICD-10-CM | POA: Diagnosis not present

## 2023-04-03 DIAGNOSIS — H18413 Arcus senilis, bilateral: Secondary | ICD-10-CM | POA: Diagnosis not present

## 2023-11-21 ENCOUNTER — Other Ambulatory Visit: Payer: Self-pay | Admitting: Emergency Medicine

## 2023-11-21 DIAGNOSIS — E785 Hyperlipidemia, unspecified: Secondary | ICD-10-CM

## 2023-12-07 ENCOUNTER — Ambulatory Visit: Admitting: Emergency Medicine

## 2023-12-09 ENCOUNTER — Ambulatory Visit: Admitting: Emergency Medicine

## 2023-12-09 ENCOUNTER — Ambulatory Visit: Payer: Self-pay | Admitting: Emergency Medicine

## 2023-12-09 ENCOUNTER — Encounter: Payer: Self-pay | Admitting: Emergency Medicine

## 2023-12-09 VITALS — BP 102/62 | HR 77 | Temp 98.0°F | Ht 71.0 in | Wt 143.0 lb

## 2023-12-09 DIAGNOSIS — E785 Hyperlipidemia, unspecified: Secondary | ICD-10-CM

## 2023-12-09 DIAGNOSIS — Z23 Encounter for immunization: Secondary | ICD-10-CM

## 2023-12-09 DIAGNOSIS — Z125 Encounter for screening for malignant neoplasm of prostate: Secondary | ICD-10-CM | POA: Diagnosis not present

## 2023-12-09 DIAGNOSIS — R4189 Other symptoms and signs involving cognitive functions and awareness: Secondary | ICD-10-CM | POA: Diagnosis not present

## 2023-12-09 DIAGNOSIS — F419 Anxiety disorder, unspecified: Secondary | ICD-10-CM

## 2023-12-09 DIAGNOSIS — H5789 Other specified disorders of eye and adnexa: Secondary | ICD-10-CM

## 2023-12-09 DIAGNOSIS — E079 Disorder of thyroid, unspecified: Secondary | ICD-10-CM

## 2023-12-09 DIAGNOSIS — R413 Other amnesia: Secondary | ICD-10-CM | POA: Insufficient documentation

## 2023-12-09 DIAGNOSIS — F32A Depression, unspecified: Secondary | ICD-10-CM

## 2023-12-09 LAB — LIPID PANEL
Cholesterol: 177 mg/dL (ref 0–200)
HDL: 65 mg/dL (ref >39.00)
LDL Cholesterol: 59 mg/dL (ref 0–99)
NonHDL: 111.99
Total CHOL/HDL Ratio: 3
Triglycerides: 267 mg/dL — ABNORMAL HIGH (ref 0.0–149.0)
VLDL: 53.4 mg/dL — ABNORMAL HIGH (ref 0.0–40.0)

## 2023-12-09 LAB — CBC WITH DIFFERENTIAL/PLATELET
Basophils Absolute: 0 K/uL (ref 0.0–0.1)
Basophils Relative: 0.3 % (ref 0.0–3.0)
Eosinophils Absolute: 0.1 K/uL (ref 0.0–0.7)
Eosinophils Relative: 1.4 % (ref 0.0–5.0)
HCT: 39.9 % (ref 39.0–52.0)
Hemoglobin: 13.8 g/dL (ref 13.0–17.0)
Lymphocytes Relative: 18 % (ref 12.0–46.0)
Lymphs Abs: 0.9 K/uL (ref 0.7–4.0)
MCHC: 34.4 g/dL (ref 30.0–36.0)
MCV: 97 fl (ref 78.0–100.0)
Monocytes Absolute: 0.5 K/uL (ref 0.1–1.0)
Monocytes Relative: 9.3 % (ref 3.0–12.0)
Neutro Abs: 3.5 K/uL (ref 1.4–7.7)
Neutrophils Relative %: 71 % (ref 43.0–77.0)
Platelets: 144 K/uL — ABNORMAL LOW (ref 150.0–400.0)
RBC: 4.12 Mil/uL — ABNORMAL LOW (ref 4.22–5.81)
RDW: 12.8 % (ref 11.5–15.5)
WBC: 4.9 K/uL (ref 4.0–10.5)

## 2023-12-09 LAB — COMPREHENSIVE METABOLIC PANEL WITH GFR
ALT: 15 U/L (ref 0–53)
AST: 23 U/L (ref 0–37)
Albumin: 4.6 g/dL (ref 3.5–5.2)
Alkaline Phosphatase: 85 U/L (ref 39–117)
BUN: 13 mg/dL (ref 6–23)
CO2: 32 meq/L (ref 19–32)
Calcium: 9.5 mg/dL (ref 8.4–10.5)
Chloride: 101 meq/L (ref 96–112)
Creatinine, Ser: 0.76 mg/dL (ref 0.40–1.50)
GFR: 88.19 mL/min (ref >60.00)
Glucose, Bld: 91 mg/dL (ref 70–99)
Potassium: 4.4 meq/L (ref 3.5–5.1)
Sodium: 138 meq/L (ref 135–145)
Total Bilirubin: 0.8 mg/dL (ref 0.2–1.2)
Total Protein: 6.7 g/dL (ref 6.0–8.3)

## 2023-12-09 LAB — VITAMIN D 25 HYDROXY (VIT D DEFICIENCY, FRACTURES): VITD: 32.36 ng/mL (ref 30.00–100.00)

## 2023-12-09 LAB — TSH: TSH: 2.08 u[IU]/mL (ref 0.35–5.50)

## 2023-12-09 LAB — PSA: PSA: 2.45 ng/mL (ref 0.10–4.00)

## 2023-12-09 LAB — HEMOGLOBIN A1C: Hgb A1c MFr Bld: 5.6 % (ref 4.6–6.5)

## 2023-12-09 LAB — VITAMIN B12: Vitamin B-12: 897 pg/mL (ref 211–911)

## 2023-12-09 NOTE — Patient Instructions (Signed)
 Health Maintenance After Age 75 After age 4, you are at a higher risk for certain long-term diseases and infections as well as injuries from falls. Falls are a major cause of broken bones and head injuries in people who are older than age 47. Getting regular preventive care can help to keep you healthy and well. Preventive care includes getting regular testing and making lifestyle changes as recommended by your health care provider. Talk with your health care provider about: Which screenings and tests you should have. A screening is a test that checks for a disease when you have no symptoms. A diet and exercise plan that is right for you. What should I know about screenings and tests to prevent falls? Screening and testing are the best ways to find a health problem early. Early diagnosis and treatment give you the best chance of managing medical conditions that are common after age 37. Certain conditions and lifestyle choices may make you more likely to have a fall. Your health care provider may recommend: Regular vision checks. Poor vision and conditions such as cataracts can make you more likely to have a fall. If you wear glasses, make sure to get your prescription updated if your vision changes. Medicine review. Work with your health care provider to regularly review all of the medicines you are taking, including over-the-counter medicines. Ask your health care provider about any side effects that may make you more likely to have a fall. Tell your health care provider if any medicines that you take make you feel dizzy or sleepy. Strength and balance checks. Your health care provider may recommend certain tests to check your strength and balance while standing, walking, or changing positions. Foot health exam. Foot pain and numbness, as well as not wearing proper footwear, can make you more likely to have a fall. Screenings, including: Osteoporosis screening. Osteoporosis is a condition that causes  the bones to get weaker and break more easily. Blood pressure screening. Blood pressure changes and medicines to control blood pressure can make you feel dizzy. Depression screening. You may be more likely to have a fall if you have a fear of falling, feel depressed, or feel unable to do activities that you used to do. Alcohol use screening. Using too much alcohol can affect your balance and may make you more likely to have a fall. Follow these instructions at home: Lifestyle Do not drink alcohol if: Your health care provider tells you not to drink. If you drink alcohol: Limit how much you have to: 0-1 drink a day for women. 0-2 drinks a day for men. Know how much alcohol is in your drink. In the [place] one drink equals one 12 oz bottle of beer (355 mL), one 5 oz glass of wine (148 mL), or one 1 oz glass of hard liquor (44 mL). Do not use any products that contain nicotine or tobacco. These products include cigarettes, chewing tobacco, and vaping devices, such as e-cigarettes. If you need help quitting, ask your health care provider. Activity  Follow a regular exercise program to stay fit. This will help you maintain your balance. Ask your health care provider what types of exercise are appropriate for you. If you need a cane or walker, use it as recommended by your health care provider. Wear supportive shoes that have nonskid soles. Safety  Remove any tripping hazards, such as rugs, cords, and clutter. Install safety equipment such as grab bars in bathrooms and safety rails on stairs. Keep rooms and walkways  well-lit. General instructions Talk with your health care provider about your risks for falling. Tell your health care provider if: You fall. Be sure to tell your health care provider about all falls, even ones that seem minor. You feel dizzy, tiredness (fatigue), or off-balance. Take over-the-counter and prescription medicines only as told by your health care provider. These include  supplements. Eat a healthy diet and maintain a healthy weight. A healthy diet includes low-fat dairy products, low-fat (lean) meats, and fiber from whole grains, beans, and lots of fruits and vegetables. Stay current with your vaccines. Schedule regular health, dental, and eye exams. Summary Having a healthy lifestyle and getting preventive care can help to protect your health and wellness after age 11. Screening and testing are the best way to find a health problem early and help you avoid having a fall. Early diagnosis and treatment give you the best chance for managing medical conditions that are more common for people who are older than age 28. Falls are a major cause of broken bones and head injuries in people who are older than age 48. Take precautions to prevent a fall at home. Work with your health care provider to learn what changes you can make to improve your health and wellness and to prevent falls. This information is not intended to replace advice given to you by your health care provider. Make sure you discuss any questions you have with your health care provider. Document Revised: 10/08/2020 Document Reviewed: 10/08/2020 Elsevier Patient Education  2024 [employer].

## 2023-12-09 NOTE — Assessment & Plan Note (Signed)
 Clinically stable. Continues to take Celexa  40 mg daily Mental health management discussed

## 2023-12-09 NOTE — Assessment & Plan Note (Addendum)
 Occasional double vision symptoms Needs to follow-up with endocrinologist and or ophthalmologist Referral to ophthalmologist placed today

## 2023-12-09 NOTE — Assessment & Plan Note (Signed)
 Cardiovascular risk associated with dyslipidemia discussed Diet and nutrition discussed Blood work done today including lipid profile Continue rosuvastatin  10 mg daily

## 2023-12-09 NOTE — Progress Notes (Signed)
 Paul Sparks. 73 y.o.   Chief Complaint  Patient presents with   Follow-up    Patient here for medication refill. LOV was 10/2021    HISTORY OF PRESENT ILLNESS: This is a 73 y.o. male here for follow-up of chronic medical conditions Last office visit with me June 2023 Concerned about cognitive abilities.  Deteriorating.  Forgetful.  Memory problems. Has been on Celexa  for a while.  Not sure why. Also on rosuvastatin  for dyslipidemia History of thyroid  eye disorder.  Needs to follow-up with thyroid  doctor. No other complaints or medical concerns today. Non-smoker.  Chronic EtOH user.  HPI   Prior to Admission medications   Medication Sig Start Date End Date Taking? Authorizing Provider  citalopram  (CELEXA ) 40 MG tablet TAKE 1 TABLET BY MOUTH EVERY DAY 01/08/23  Yes Darica Goren, Emil Schanz, MD  Multiple Vitamins-Minerals (MULTIVITAMIN WITH MINERALS) tablet Take 1 tablet by mouth daily.   Yes [provider]  rosuvastatin  (CRESTOR ) 10 MG tablet TAKE 1 TABLET BY MOUTH EVERY DAY 12/07/22  Yes Alaysha Jefcoat, Emil Schanz, MD    Allergies  Allergen Reactions   Celebrex [Celecoxib] Hives    REACTION: Hives   Flexeril [Cyclobenzaprine Hcl] Other (See Comments)    arrhythmias    Nsaids Other (See Comments)    Unknown reaction per pt   Sulfa Drugs Cross Reactors Hives    Patient Active Problem List   Diagnosis Date Noted   Thyroid  eye disease 10/13/2018   Anxiety and depression 12/20/2017   Other neutropenia (HCC) 04/20/2015   Degenerative disc disease, lumbar 08/02/2012   Degenerative disc disease, cervical 08/02/2012   Folate deficiency 04/27/2012   Pure hypercholesterolemia 04/27/2012   Irritable bowel syndrome with diarrhea 03/24/2012   Inguinal hernia 12/10/2010   Prostate enlargement    GERD 09/21/2008    Past Medical History:  Diagnosis Date   Allergy    Anxiety    Arthritis    BPPV (benign paroxysmal positional vertigo), unspecified laterality    Colon polyp  11/24/2013   colonoscopy Gwendlyn Buddy. Repeat in 5 years.   Dermatofibroma of back 12/11/10   [name].   Eczema 12/11/10   [name].   Genital herpes    IBD (inflammatory bowel disease)    diarrhea, abdominal cramping.   Inguinal hernia    Bilateral; s/p repair.   Prostate enlargement    mildly and asymptomatic   Seborrheic keratosis 12/11/10   [name]/Dermatology consult   Thyroid  disease    Wears glasses     Past Surgical History:  Procedure Laterality Date   COLONOSCOPY  11/24/2013   four polyp. Gwendlyn Buddy, MD.  Repeat in 5 years.   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      Children: two children (28, 31); no grandchildren.  Several step grandchildren (9).        Lives: with wife. 1 dog.      Employment:  Retired 06/2015.  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        Tobacco: none      Alcohol: 2 glasses of wine 4x week.  Very rare intoxication.      Drugs: none      Exercise:  Walks daily for 20 minutes 1.3 miles per day; walks dog regularly.       Seatbelt: 100% of time      Guns in home: none      Sunscreen: yes; rare sun exposure.      ADLs: independent with ADLs. Drives      Advanced Directives:  YES; living will; FULL CODE; no prolonged measures.     Social Drivers of Corporate investment banker Strain: Low Risk  (03/06/2022)   Overall Financial Resource Strain (CARDIA)    Difficulty of Paying Living Expenses: Not hard at all  Food Insecurity: No Food Insecurity (03/06/2022)   Hunger Vital Sign    Worried About Running Out of Food in the Last Year: Never true    Ran Out of Food in the Last Year: Never true  Transportation Needs: No Transportation Needs (03/06/2022)   PRAPARE - Administrator, Civil Service (Medical): No    Lack of Transportation (Non-Medical): No  Physical Activity: Sufficiently Active (03/06/2022)   Exercise Vital Sign    Days of Exercise per Week: 7 days    Minutes of Exercise per Session: 60 min  Stress: No Stress Concern Present (03/06/2022)   [employer] of Occupational Health - Occupational Stress Questionnaire    Feeling of Stress : Not at all  Social Connections: Moderately Isolated (03/06/2022)   Social Connection and Isolation Panel    Frequency of Communication with Friends and Family: Twice a week    Frequency of Social Gatherings with Friends and Family: Twice a week    Attends Religious Services: Never    Database administrator or Organizations: No    Attends Banker Meetings: Never    Marital Status: Married  Catering manager Violence: Not At Risk (03/06/2022)   Humiliation, Afraid, Rape, and Kick questionnaire    Fear of Current or Ex-Partner: No    Emotionally Abused: No    Physically Abused: No    Sexually Abused: No    Family History  Problem Relation Age of Onset   Dementia Mother        Multi-infarct dementia   Stroke Mother    Diabetes Father    Cancer Paternal Grandfather        lung   Colon cancer Neg Hx    Pancreatic cancer Neg Hx    Rectal  cancer Neg Hx    Stomach cancer Neg Hx    Colon polyps Neg Hx      Review of Systems  Constitutional: Negative.  Negative for chills and fever.  HENT: Negative.  Negative for congestion and sore throat.   Eyes:  Positive for double vision.  Respiratory: Negative.  Negative for cough and shortness of breath.   Cardiovascular: Negative.  Negative for chest pain and palpitations.  Gastrointestinal:  Negative for abdominal pain, diarrhea, nausea and vomiting.  Genitourinary: Negative.  Negative for dysuria and hematuria.  Skin: Negative.  Negative for rash.  Neurological: Negative.  Negative for dizziness, speech change, focal weakness and headaches.    Vitals:   12/09/23 0958  BP: 102/62  Pulse: 77  Temp: 98 F (36.7 C)  SpO2: 98%    Physical Exam Vitals reviewed.  Constitutional:      Appearance: Normal appearance.  HENT:     Head: Normocephalic.     Right Ear: Tympanic membrane, ear canal and external ear normal.     Left Ear: Tympanic membrane, ear canal and external ear normal.     Mouth/Throat:     Mouth: Mucous membranes are moist.     Pharynx: Oropharynx is clear.  Eyes:     Extraocular Movements: Extraocular movements intact.     Conjunctiva/sclera: Conjunctivae normal.     Pupils: Pupils are equal, round, and reactive to light.  Cardiovascular:     Rate and Rhythm: Normal rate and regular rhythm.     Pulses: Normal pulses.     Heart sounds: Normal heart sounds.  Pulmonary:     Effort: Pulmonary effort is normal.     Breath sounds: Normal breath sounds.  Abdominal:     Palpations: Abdomen is soft.     Tenderness: There is no abdominal tenderness.  Musculoskeletal:     Cervical back: No tenderness.     Right lower leg: No edema.     Left lower leg: No edema.  Lymphadenopathy:     Cervical: No cervical adenopathy.  Skin:    General: Skin is warm and dry.  Neurological:     General: No focal deficit present.     Mental Status: He is alert and oriented  to person, place, and time.  Psychiatric:        Mood and Affect: Mood normal.        Behavior: Behavior normal.      ASSESSMENT & PLAN: A total of 44 minutes was spent with the patient and counseling/coordination of care regarding preparing for this visit, review of most recent office visit notes, review of multiple chronic medical conditions and their management, review of all medications, review of most recent bloodwork results, review of health maintenance items, education on nutrition, prognosis, documentation, and need for follow up.   Problem List Items Addressed This Visit       Endocrine   Thyroid  eye disease   Occasional double vision symptoms Needs to follow-up with endocrinologist and or ophthalmologist Referral to ophthalmologist placed today      Relevant Orders   TSH   Ambulatory referral to Ophthalmology     Other   Anxiety and depression   Clinically stable. Continues to take Celexa  40 mg daily Mental health management discussed      Cognitive decline - Primary   Slowly progressing.  Multiple symptoms Recommend neurology evaluation.  Referral placed today.      Relevant Orders   CBC with Differential/Platelet   Comprehensive metabolic panel with GFR   Hemoglobin A1c   TSH   Vitamin B12   VITAMIN D  25 Hydroxy (Vit-D Deficiency, Fractures)   Ambulatory referral to Neurology   Dyslipidemia   Cardiovascular risk associated with dyslipidemia discussed Diet and nutrition discussed Blood work done  today including lipid profile Continue rosuvastatin  10 mg daily      Relevant Orders   Comprehensive metabolic panel with GFR   Hemoglobin A1c   Lipid panel   Other Visit Diagnoses       Need for vaccination       Relevant Orders   Tdap vaccine greater than or equal to 7yo IM     Screening for prostate cancer       Relevant Orders   PSA      Patient Instructions  Health Maintenance After Age 89 After age 77, you are at a higher risk for  certain long-term diseases and infections as well as injuries from falls. Falls are a major cause of broken bones and head injuries in people who are older than age 50. Getting regular preventive care can help to keep you healthy and well. Preventive care includes getting regular testing and making lifestyle changes as recommended by your health care provider. Talk with your health care provider about: Which screenings and tests you should have. A screening is a test that checks for a disease when you have no symptoms. A diet and exercise plan that is right for you. What should I know about screenings and tests to prevent falls? Screening and testing are the best ways to find a health problem early. Early diagnosis and treatment give you the best chance of managing medical conditions that are common after age 80. Certain conditions and lifestyle choices may make you more likely to have a fall. Your health care provider may recommend: Regular vision checks. Poor vision and conditions such as cataracts can make you more likely to have a fall. If you wear glasses, make sure to get your prescription updated if your vision changes. Medicine review. Work with your health care provider to regularly review all of the medicines you are taking, including over-the-counter medicines. Ask your health care provider about any side effects that may make you more likely to have a fall. Tell your health care provider if any medicines that you take make you feel dizzy or sleepy. Strength and balance checks. Your health care provider may recommend certain tests to check your strength and balance while standing, walking, or changing positions. Foot health exam. Foot pain and numbness, as well as not wearing proper footwear, can make you more likely to have a fall. Screenings, including: Osteoporosis screening. Osteoporosis is a condition that causes the bones to get weaker and break more easily. Blood pressure screening.  Blood pressure changes and medicines to control blood pressure can make you feel dizzy. Depression screening. You may be more likely to have a fall if you have a fear of falling, feel depressed, or feel unable to do activities that you used to do. Alcohol use screening. Using too much alcohol can affect your balance and may make you more likely to have a fall. Follow these instructions at home: Lifestyle Do not drink alcohol if: Your health care provider tells you not to drink. If you drink alcohol: Limit how much you have to: 0-1 drink a day for women. 0-2 drinks a day for men. Know how much alcohol is in your drink. In the [place] one drink equals one 12 oz bottle of beer (355 mL), one 5 oz glass of wine (148 mL), or one 1 oz glass of hard liquor (44 mL). Do not use any products that contain nicotine or tobacco. These products include cigarettes, chewing tobacco, and vaping devices, such as e-cigarettes. If  you need help quitting, ask your health care provider. Activity  Follow a regular exercise program to stay fit. This will help you maintain your balance. Ask your health care provider what types of exercise are appropriate for you. If you need a cane or walker, use it as recommended by your health care provider. Wear supportive shoes that have nonskid soles. Safety  Remove any tripping hazards, such as rugs, cords, and clutter. Install safety equipment such as grab bars in bathrooms and safety rails on stairs. Keep rooms and walkways well-lit. General instructions Talk with your health care provider about your risks for falling. Tell your health care provider if: You fall. Be sure to tell your health care provider about all falls, even ones that seem minor. You feel dizzy, tiredness (fatigue), or off-balance. Take over-the-counter and prescription medicines only as told by your health care provider. These include supplements. Eat a healthy diet and maintain a healthy weight. A healthy  diet includes low-fat dairy products, low-fat (lean) meats, and fiber from whole grains, beans, and lots of fruits and vegetables. Stay current with your vaccines. Schedule regular health, dental, and eye exams. Summary Having a healthy lifestyle and getting preventive care can help to protect your health and wellness after age 35. Screening and testing are the best way to find a health problem early and help you avoid having a fall. Early diagnosis and treatment give you the best chance for managing medical conditions that are more common for people who are older than age 7. Falls are a major cause of broken bones and head injuries in people who are older than age 27. Take precautions to prevent a fall at home. Work with your health care provider to learn what changes you can make to improve your health and wellness and to prevent falls. This information is not intended to replace advice given to you by your health care provider. Make sure you discuss any questions you have with your health care provider. Document Revised: 10/08/2020 Document Reviewed: 10/08/2020 Elsevier Patient Education  2024 [employer] Inc[employer]     Emil Schaumann, MD [facility]

## 2023-12-09 NOTE — Assessment & Plan Note (Signed)
 Slowly progressing.  Multiple symptoms Recommend neurology evaluation.  Referral placed today.

## 2023-12-14 ENCOUNTER — Other Ambulatory Visit: Payer: Self-pay | Admitting: Emergency Medicine

## 2023-12-14 DIAGNOSIS — E785 Hyperlipidemia, unspecified: Secondary | ICD-10-CM

## 2024-03-07 ENCOUNTER — Encounter: Payer: Self-pay | Admitting: Physician Assistant

## 2024-04-01 ENCOUNTER — Encounter: Payer: Self-pay | Admitting: Pharmacist

## 2024-04-01 NOTE — Progress Notes (Signed)
 Pharmacy Quality Measure Review  This patient is appearing on a report for being at risk of failing the adherence measure for cholesterol (statin) medications this calendar year.   Medication: rosuvastatin  10 mg Last fill date: 03/11/24 for 90 day supply  Insurance report was not up to date. No action needed at this time.   Darrelyn Drum, PharmD, BCPS, CPP Clinical Pharmacist Practitioner [facility] [phone]

## 2024-04-15 ENCOUNTER — Ambulatory Visit (INDEPENDENT_AMBULATORY_CARE_PROVIDER_SITE_OTHER)

## 2024-04-15 VITALS — Ht 71.0 in | Wt 143.0 lb

## 2024-04-15 DIAGNOSIS — Z0000 Encounter for general adult medical examination without abnormal findings: Secondary | ICD-10-CM | POA: Diagnosis not present

## 2024-04-15 NOTE — Patient Instructions (Signed)
 Paul Sparks,  Thank you for taking the time for your Medicare Wellness Visit. I appreciate your continued commitment to your health goals. Please review the care plan we discussed, and feel free to reach out if I can assist you further.  Please note that Annual Wellness Visits do not include a physical exam. Some assessments may be limited, especially if the visit was conducted virtually. If needed, we may recommend an in-person follow-up with your provider.  Ongoing Care Seeing your primary care provider every 3 to 6 months helps us  monitor your health and provide consistent, personalized care.   Referrals If a referral was made during today's visit and you haven't received any updates within two weeks, please contact the referred provider directly to check on the status.  Recommended Screenings:  Health Maintenance  Topic Date Due   Zoster (Shingles) Vaccine (1 of 2) 11/11/1969   DTaP/Tdap/Td vaccine (2 - Td or Tdap) 03/24/2022   Medicare Annual Wellness Visit  03/07/2023   Flu Shot  01/01/2024   COVID-19 Vaccine (5 - 2025-26 season) 02/01/2024   Colon Cancer Screening  02/23/2024   Pneumococcal Vaccine for age over 28  Completed   Hepatitis C Screening  Completed   Meningitis B Vaccine  Aged Out       04/15/2024    3:17 PM  Advanced Directives  Does Patient Have a Medical Advance Directive? Yes  Type of Estate Agent of [place];Living will  Copy of Healthcare Power of Attorney in Chart? No - copy requested    Vision: Annual vision screenings are recommended for early detection of glaucoma, cataracts, and diabetic retinopathy. These exams can also reveal signs of chronic conditions such as diabetes and high blood pressure.  Dental: Annual dental screenings help detect early signs of oral cancer, gum disease, and other conditions linked to overall health, including heart disease and diabetes.

## 2024-04-15 NOTE — Progress Notes (Addendum)
 Chief Complaint  Patient presents with   Medicare Wellness     Subjective:  Please attest and cosign this visit due to patients primary care provider not being in the office at the time the visit was completed.  (Pt of Dr CHRISTELLA. Purcell)   Paul M Sherrill Jr. is a 73 y.o. male who presents for a Medicare Annual Wellness Visit.  I connected with  Paul M Nocito Jr. on 04/18/24 by a audio enabled telemedicine application and verified that I am speaking with the correct person using two identifiers.  Patient Location: Home  Provider Location: Office/Clinic  Persons Participating in Visit: Patient.  I discussed the limitations of evaluation and management by telemedicine. The patient expressed understanding and agreed to proceed.  Vital Signs: Because this visit was a virtual/telehealth visit, some criteria may be missing or patient reported. Any vitals not documented were not able to be obtained and vitals that have been documented are patient reported.   Allergies (verified) Celebrex [celecoxib], Flexeril [cyclobenzaprine hcl], Nsaids, and Sulfa drugs cross reactors   History: Past Medical History:  Diagnosis Date   Allergy    Anxiety    Arthritis    BPPV (benign paroxysmal positional vertigo), unspecified laterality    Colon polyp 11/24/2013   colonoscopy Gwendlyn Buddy. Repeat in 5 years.   Dermatofibroma of back 12/11/10   [name].   Eczema 12/11/10   [name].   Genital herpes    IBD (inflammatory bowel disease)    diarrhea, abdominal cramping.   Inguinal hernia    Bilateral; s/p repair.   Prostate enlargement    mildly and asymptomatic   Seborrheic keratosis 12/11/10   [name]/Dermatology consult   Thyroid  disease    Wears glasses    Past Surgical History:  Procedure Laterality Date   COLONOSCOPY  11/24/2013   four polyp. Gwendlyn Buddy, MD.  Repeat in 5 years.   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82  Tobacco Use   Smoking status: Never   Smokeless tobacco: Never  Vaping Use   Vaping status: Never Used  Substance and Sexual Activity   Alcohol use: Yes    Alcohol/week: 11.0 standard drinks of alcohol    Types: 10 Glasses of wine, 1 Cans of beer per week   Drug use: No   Sexual activity: Yes    Birth control/protection: Post-menopausal, Surgical   Tobacco Counseling Counseling given: Not Answered  SDOH Screenings   Food Insecurity: No Food Insecurity (04/15/2024)  Housing: Unknown (04/15/2024)  Transportation Needs: No Transportation Needs (04/15/2024)  Utilities: Not At Risk (04/15/2024)  Alcohol Screen: Low Risk  (03/06/2022)  Depression (PHQ2-9): Low Risk  (04/15/2024)  Financial Resource Strain: Low Risk  (03/06/2022)  Physical Activity: Sufficiently Active (04/15/2024)  Social Connections: Moderately Isolated (04/15/2024)  Stress: No Stress Concern Present (04/15/2024)  Tobacco Use: Low Risk  (04/15/2024)  Health Literacy: Adequate Health Literacy (04/15/2024)   See flowsheets for full screening details  Depression Screen PHQ 2 & 9 Depression Scale- Over the past 2 weeks, how often have you been bothered by any of the following problems? Little interest or pleasure in doing things: 0 Feeling down, depressed, or hopeless (PHQ Adolescent also includes...irritable): 0 PHQ-2 Total Score: 0 Trouble falling or staying asleep, or sleeping too much: 0 Feeling tired or having little energy: 0 Poor  appetite or overeating (PHQ Adolescent also includes...weight loss): 0 Feeling bad about yourself - or that you are a failure or have let yourself or your family down: 0 Trouble concentrating on things, such as reading the newspaper or watching television (PHQ Adolescent also includes...like school work): 0 Moving or speaking so slowly that other people could have noticed. Or the opposite - being so fidgety or restless that you have been moving around a lot more than usual: 0 Thoughts that you would be better off dead, or of hurting yourself in some way: 0 PHQ-9 Total Score: 0  Depression Treatment Depression Interventions/Treatment : PHQ2-9 Score <4 Follow-up Not Indicated     Goals Addressed               This Visit's Progress     Patient Stated (pt-stated)        Patient stated he plans to continue being active and exercising       Visit info / Clinical Intake: Medicare Wellness Visit Type:: Subsequent Annual Wellness Visit Persons participating in visit:: patient Medicare Wellness Visit Mode:: Telephone If telephone:: video declined Because this visit was a virtual/telehealth visit:: vitals recorded from last visit If Telephone or Video please confirm:: I connected with the patient using audio enabled telemedicine application and verified that I am speaking with the correct person using two identifiers; I discussed the limitations of evaluation and management by telemedicine; The patient expressed understanding and agreed to proceed Patient Location:: Home Provider Location:: Office Information given by:: patient Interpreter Needed?: No Pre-visit prep was completed: yes AWV questionnaire completed by patient prior to visit?: no Living arrangements:: lives with spouse/significant other Patient's Overall Health Status Rating: good Typical amount of pain: none Does pain affect daily life?: no Are you currently prescribed opioids?: no  Dietary Habits and Nutritional  Risks How many meals a day?: 3 Eats fruit and vegetables daily?: yes Most meals are obtained by: preparing own meals In the last 2 weeks, have you had any of the following?: none Diabetic:: no  Functional Status Activities of Daily Living (to include ambulation/medication): Independent Ambulation: Independent with device- listed below Home Assistive Devices/Equipment: Eyeglasses (has hearing aids) Medication Administration: Independent Home Management: Independent Manage your own finances?: yes Primary transportation is: family/friends Concerns about vision?: no *vision screening is required for WTM* Concerns about hearing?: (!) yes Uses hearing aids?: (!) yes (plans to restart using) Hear whispered voice?: yes  Fall Screening Falls in the past year?: 1 Number of falls in past year: 1 (>5 falls due to tripping over dog) Was there an injury with Fall?: 1 Fall Risk Category Calculator: 3 Patient Fall Risk Level: High Fall Risk  Fall Risk Patient at Risk for Falls Due to: Other (Comment) (only due to walking dog) Fall risk Follow up: Falls evaluation completed; Falls prevention discussed  Home and Transportation Safety: All rugs have  non-skid backing?: yes All stairs or steps have railings?: yes Grab bars in the bathtub or shower?: (!) no Have non-skid surface in bathtub or shower?: yes Good home lighting?: yes Regular seat belt use?: yes Hospital stays in the last year:: no  Cognitive Assessment Difficulty concentrating, remembering, or making decisions? : no Will 6CIT or Mini Cog be Completed: yes What year is it?: 0 points What month is it?: 0 points Give patient an address phrase to remember (5 components): [address] [place], [place] About what time is it?: 0 points Count backwards from 20 to 1: 0 points Say the months of the year in reverse: 0 points Repeat the address phrase from earlier: 10 points (unable to remember address) 6 CIT Score: 10 points  Advance  Directives (For Healthcare) Does Patient Have a Medical Advance Directive?: Yes Type of Advance Directive: Healthcare Power of [place]; Living will Copy of Healthcare Power of Attorney in Chart?: No - copy requested Copy of Living Will in Chart?: No - copy requested  Reviewed/Updated  Reviewed/Updated: Reviewed All (Medical, Surgical, Family, Medications, Allergies, Care Teams, Patient Goals)        Objective:    Today's Vitals   04/15/24 1513  Weight: 143 lb (64.9 kg)  Height: 5' 11 (1.803 m)   Body mass index is 19.94 kg/m.  Current Medications (verified) Outpatient Encounter Medications as of 04/15/2024  Medication Sig   citalopram  (CELEXA ) 40 MG tablet TAKE 1 TABLET BY MOUTH EVERY DAY   Multiple Vitamins-Minerals (MULTIVITAMIN WITH MINERALS) tablet Take 1 tablet by mouth daily.   rosuvastatin  (CRESTOR ) 10 MG tablet TAKE 1 TABLET BY MOUTH EVERY DAY   No facility-administered encounter medications on file as of 04/15/2024.   Hearing/Vision screen Hearing Screening - Comments:: Has hearing aids - does not wear Vision Screening - Comments:: Wears rx glasses - up to date with routine eye exams with A[facility] Lucky Immunizations and Health Maintenance Health Maintenance  Topic Date Due   Zoster Vaccines- Shingrix (1 of 2) 11/11/1967   DTaP/Tdap/Td (2 - Td or Tdap) 03/24/2022   Influenza Vaccine  01/01/2024   COVID-19 Vaccine (5 - 2025-26 season) 02/01/2024   Colonoscopy  04/18/2025 (Originally 02/23/2024)   Medicare Annual Wellness (AWV)  04/15/2025   Pneumococcal Vaccine: 50+ Years  Completed   Hepatitis C Screening  Completed   Meningococcal B Vaccine  Aged Out        Assessment/Plan:  This is a routine wellness examination for Paul Sparks.  Patient Care Team: [facility]cell Emil Schanz, MD as PCP - General (Internal Medicine) Lucky Aureliano as Consulting Physician [facility])  I have personally reviewed and noted the following in the patient's chart:   Medical and social  history Use of alcohol, tobacco or illicit drugs  Current medications and supplements including opioid prescriptions. Functional ability and status Nutritional status Physical activity Advanced directives List of other physicians Hospitalizations, surgeries, and ER visits in previous 12 months Vitals Screenings to include cognitive, depression, and falls Referrals and appointments  No orders of the defined types were placed in this encounter.  In addition, I have reviewed and discussed with patient certain preventive protocols, quality metrics, and best practice recommendations. A written personalized care plan for preventive services as well as general preventive health recommendations were provided to patient.   Verdie CHRISTELLA Saba, CMA   04/18/2024   Return in 1 year (on 04/15/2025).  After Visit Summary: (MyChart) Due to this being a telephonic visit, the after visit summary with patients personalized  plan was offered to patient via MyChart   Nurse Notes: I have recommended that this patient have a Colonoscopy but he declines at this time. I have discussed the risks and benefits of this procedure with him. The patient verbalizes understanding.   Scheduled an AWV/CPE appts in 2026.

## 2024-05-22 NOTE — Progress Notes (Incomplete)
 "    Memory Impairment of unclear etiology, concern for vascular  Paul Sparks. is a very pleasant 73 y.o. year old RH male with a history of  hyperlipidemia, hypothyroidism, anxiety, alcohol habituation,  HOH, DJD/arthritis, BPPV, IBS diarrhea,  seen today for evaluation of memory loss. MoCA unable to be tested as provoked significant anxiety and overwhelmed.  MMSE was 20/30.  He was immediately amnestic presenting the words for delayed recall.  Etiology unclear, concern for vascular, alcohol intake, strong family history of cerebrovascular disease and to a certain extent prior TBI's (played rugby).  Will continue with the workup.  Patient is able to participate on ADLs.  Mood is anxious without agitation, followed by PCP.    MRI brain without contrast to assess for underlying structural abnormality and assess vascular load.  Pending on the results, will consider starting patient on Aricept daily. Check B1 Alcohol reduction and eventual cessation counseled  Continue to control mood as per PCP Recommend good control of cardiovascular risk factors Fol[place]ow up in 3 months   Discussed the use of AI scribe software for clinical note transcription with the patient, who gave verbal consent to proceed.  History of Present Illness  Paul Sparks is a 73 year old male seen today for memory loss. He is accompanied by his wife, who is also his primary caregiver. He has been experiencing memory issues and word-finding difficulties for over a year, with the word-finding difficulty progressively worsening over the past two years. He has developed compensatory mechanisms to aid in word retrieval, such as using notes and associating words with familiar concepts.  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  His long-term memory remains fair, but he repeats himself. No wandering tendencies or disorientation in familiar places. Family history is significant for vascular dementia no apparent  Alzheimer's disease.No hallucinations, personality changes, sleep disturbances, REM behavior disorder, sleepwalking, or history of stroke.  He has a history of  thyroid  eye disease, which was treated with infusions that coincided with the onset of hearing loss. Various hearing aids have been tried, with over-the-counter options currently providing the best results.  Aside from his decreased hearing, the patient does report issues with comprehension.  His thyroid  levels are reportedly normal. He does not drive due to double vision related to thyroid  eye disease, which began before the COVID-19 pandemic and was treated with infusions that slowed its progression but did not correct it.  He has experienced a recent loss of smell and a diminished sense of taste, which may have led to a slight decrease in appetite. He has not had any recent COVID-19 infections. He experiences benign positional vertigo that is resolved with Epley maneuvers.  He has a history of history of head injuries, including concussions from playing rugby and a recent fall caused by a dog. He has a light intention tremor, not addressed, without any other parkinsonian signs.     He has a history of alcohol habituation, consuming approximately 14 drinks per week, consisting of beer and wine.  He has a history of irritable bowel syndrome with diarrhea, which is currently well-managed. He reports an overactive bladder as well.     Pertinent available labs:  TSH 2.08, B12 897, D 32, TG 267, A1C 5.6, plt 144 (? ETOH)     Past Medical History:  Diagnosis Date   Allergy    Anxiety    Arthritis    BPPV (benign paroxysmal positional vertigo),  unspecified laterality    Colon polyp 11/24/2013   colonoscopy Gwendlyn Buddy. Repeat in 5 years.   Dermatofibroma of back 12/11/10   [name].   Eczema 12/11/10   [name].   Genital herpes    IBD (inflammatory bowel disease)    diarrhea, abdominal cramping.   Inguinal  hernia    Bilateral; s/p repair.   Prostate enlargement    mildly and asymptomatic   Seborrheic keratosis 12/11/10   [name]/Dermatology consult   Thyroid  disease    Wears glasses      Past Surgical History:  Procedure Laterality Date   COLONOSCOPY  11/24/2013   four polyp. Gwendlyn Buddy, MD.  Repeat in 5 years.   HERNIA REPAIR N/A    [employer] 07/31/2020   INGUINAL HERNIA REPAIR  2005   right   KNEE ARTHROSCOPY     POLYPECTOMY     TONSILLECTOMY     VASECTOMY       Allergies[1]  Current Outpatient Medications  Medication Instructions   citalopram  (CELEXA ) 40 mg, Oral, Daily   Multiple Vitamins-Minerals (MULTIVITAMIN WITH MINERALS) tablet 1 tablet, Daily   rosuvastatin  (CRESTOR ) 10 mg, Oral, Daily     VITALS:   Vitals:   05/23/24 0738  BP: 114/70  Pulse: 83  Resp: 18  SpO2: 99%  Weight: 137 lb (62.1 kg)  Height: 5' 11 (1.803 m)          No data to display             05/23/2024    8:00 AM  MMSE - Mini Mental State Exam  Orientation to time 3  Orientation to Place 3  Registration 3  Attention/ Calculation 2  Recall 0  Language- name 2 objects 2  Language- repeat 1  Language- follow 3 step command 3  Language- read & follow direction 1  Write a sentence 1  Copy design 1  Total score 20     Neurological Exam    Orientation:  Alert and oriented to person, not to place and time despite significant effort to produce the date. No aphasia or dysarthria. Fund of knowledge is appropriate. Recent and remote memory impaired.  Attention and concentration are normal.  Able to name objects and repeat phrases. Delayed recall 0/3 Cranial nerves: There is good facial symmetry. Extraocular muscles are intact and visual fields are full to confrontational testing. Speech is fluent and clear, tangential at times. No tongue deviation. Hearing is intact to conversational tone. Tone: Tone is good throughout. Abnormal movements: He has minimal right greater than left  intention tremors. No Asterixis. No Fasciculations Sensation: Sensation is intact to light touch. Vibration is intact at the bilateral big toe.  Coordination: The patient has no difficulty with RAM's or FNF bilaterally. Normal finger to nose  Motor: Strength is 5/5 in the bilateral upper and lower extremities. There is no pronator drift. There are no fasciculations noted. DTR's: Deep tendon reflexes are 2/4 bilaterally. Gait and Station: The patient is able to ambulate without difficulty The patient is able to heel toe walk. Gait is cautious and narrow. The patient is able to ambulate in a tandem fashion.       Thank you for allowing us  the opportunity to participate in the care of this nice patient. Please do not hesitate to contact us  for any questions or concerns.   Total time spent on today's visit was 60 minutes dedicated to this patient today, preparing to see patient, examining the patient, ordering tests and/or  medications and counseling the patient, documenting clinical information in the EHR or other health record, independently interpreting results and communicating results to the patient/family, discussing treatment and goals, answering patient's questions and coordinating care.  Cc:  Purcell Emil Schanz, MD  Camie Sevin 05/23/2024 9:31 AM       [1]  Allergies Allergen Reactions   Celebrex [Celecoxib] Hives    REACTION: Hives   Flexeril [Cyclobenzaprine Hcl] Other (See Comments)    arrhythmias    Nsaids Other (See Comments)    Unknown reaction per pt   Sulfa Drugs Cross Reactors Hives   "

## 2024-05-23 ENCOUNTER — Ambulatory Visit: Admitting: Physician Assistant

## 2024-05-23 ENCOUNTER — Other Ambulatory Visit

## 2024-05-23 ENCOUNTER — Encounter: Payer: Self-pay | Admitting: Physician Assistant

## 2024-05-23 ENCOUNTER — Ambulatory Visit

## 2024-05-23 VITALS — BP 114/70 | HR 83 | Resp 18 | Ht 71.0 in | Wt 137.0 lb

## 2024-05-23 DIAGNOSIS — R413 Other amnesia: Secondary | ICD-10-CM | POA: Diagnosis not present

## 2024-05-23 NOTE — Patient Instructions (Signed)
 Labs today [address] Mri of the [facility] at [facility] [phone]

## 2024-05-26 LAB — VITAMIN B1: Vitamin B1 (Thiamine): 19 nmol/L (ref 8–30)

## 2024-05-27 ENCOUNTER — Ambulatory Visit: Payer: Self-pay | Admitting: Physician Assistant

## 2024-06-09 ENCOUNTER — Ambulatory Visit
Admission: RE | Admit: 2024-06-09 | Discharge: 2024-06-09 | Disposition: A | Discharge status: Home / Self Care | Source: Ambulatory Visit | Attending: Physician Assistant | Admitting: Physician Assistant

## 2024-06-14 ENCOUNTER — Encounter: Payer: Self-pay | Admitting: Physician Assistant

## 2024-06-14 ENCOUNTER — Other Ambulatory Visit: Payer: Self-pay | Admitting: Physician Assistant

## 2024-06-16 ENCOUNTER — Telehealth: Payer: Self-pay | Admitting: Physician Assistant

## 2024-06-16 NOTE — Telephone Encounter (Signed)
 Pt's wife Earnie called in this afternoon and she stated that she would like to get a call onces the MRI results comes in. Thanks.

## 2024-06-20 ENCOUNTER — Other Ambulatory Visit: Payer: Self-pay | Admitting: Physician Assistant

## 2024-06-20 MED ORDER — DONEPEZIL HCL 10 MG PO TABS: ORAL_TABLET | ORAL | 3 refills | Status: DC

## 2024-06-21 ENCOUNTER — Other Ambulatory Visit: Payer: Self-pay | Admitting: Physician Assistant

## 2024-06-21 MED ORDER — DONEPEZIL HCL 10 MG PO TABS: ORAL_TABLET | ORAL | 3 refills | Status: AC

## 2024-08-01 ENCOUNTER — Ambulatory Visit: Payer: Self-pay | Admitting: Physician Assistant
